# Patient Record
Sex: Female | Born: 1995 | Race: White | Hispanic: No | Marital: Single | Smoking: Former smoker
Health system: Southern US, Community
[De-identification: ages and names within clinical notes are randomized; demographics above are authoritative.]

## PROBLEM LIST (undated history)

## (undated) ENCOUNTER — Inpatient Hospital Stay: Payer: Self-pay

## (undated) DIAGNOSIS — J40 Bronchitis, not specified as acute or chronic: Secondary | ICD-10-CM

## (undated) DIAGNOSIS — J45909 Unspecified asthma, uncomplicated: Secondary | ICD-10-CM

## (undated) DIAGNOSIS — K219 Gastro-esophageal reflux disease without esophagitis: Secondary | ICD-10-CM

## (undated) HISTORY — PX: NO PAST SURGERIES: SHX2092

## (undated) HISTORY — PX: WISDOM TOOTH EXTRACTION: SHX21

---
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
## 2016-04-23 ENCOUNTER — Encounter: Payer: Self-pay | Admitting: Emergency Medicine

## 2016-04-23 ENCOUNTER — Emergency Department
Admission: EM | Admit: 2016-04-23 | Discharge: 2016-04-23 | Disposition: A | Discharge status: Home / Self Care | Payer: Medicaid Other | Attending: Emergency Medicine | Admitting: Emergency Medicine

## 2016-04-23 DIAGNOSIS — O23591 Infection of other part of genital tract in pregnancy, first trimester: Secondary | ICD-10-CM | POA: Insufficient documentation

## 2016-04-23 DIAGNOSIS — J45909 Unspecified asthma, uncomplicated: Secondary | ICD-10-CM | POA: Insufficient documentation

## 2016-04-23 DIAGNOSIS — Z3A01 Less than 8 weeks gestation of pregnancy: Secondary | ICD-10-CM | POA: Insufficient documentation

## 2016-04-23 DIAGNOSIS — O99331 Smoking (tobacco) complicating pregnancy, first trimester: Secondary | ICD-10-CM | POA: Insufficient documentation

## 2016-04-23 DIAGNOSIS — N760 Acute vaginitis: Secondary | ICD-10-CM

## 2016-04-23 DIAGNOSIS — F17210 Nicotine dependence, cigarettes, uncomplicated: Secondary | ICD-10-CM | POA: Diagnosis not present

## 2016-04-23 DIAGNOSIS — B9689 Other specified bacterial agents as the cause of diseases classified elsewhere: Secondary | ICD-10-CM

## 2016-04-23 DIAGNOSIS — O26891 Other specified pregnancy related conditions, first trimester: Secondary | ICD-10-CM | POA: Diagnosis present

## 2016-04-23 DIAGNOSIS — R300 Dysuria: Secondary | ICD-10-CM

## 2016-04-23 LAB — URINALYSIS COMPLETE WITH MICROSCOPIC (ARMC ONLY)
BACTERIA UA: NONE SEEN
BILIRUBIN URINE: NEGATIVE
Glucose, UA: NEGATIVE mg/dL
Nitrite: NEGATIVE
PH: 6 (ref 5.0–8.0)
PROTEIN: 30 mg/dL — AB
Specific Gravity, Urine: 1.03 (ref 1.005–1.030)

## 2016-04-23 LAB — CHLAMYDIA/NGC RT PCR (ARMC ONLY)
Chlamydia Tr: NOT DETECTED
N gonorrhoeae: NOT DETECTED

## 2016-04-23 LAB — WET PREP, GENITAL
Sperm: NONE SEEN
TRICH WET PREP: NONE SEEN
YEAST WET PREP: NONE SEEN

## 2016-04-23 MED ORDER — CEFTRIAXONE SODIUM 250 MG IJ SOLR
250.0000 mg | INTRAMUSCULAR | Status: DC
  Administered 2016-04-23: 250 mg via INTRAMUSCULAR

## 2016-04-23 MED ORDER — AZITHROMYCIN 250 MG PO TABS
1000.0000 mg | ORAL_TABLET | Freq: Once | ORAL | Status: AC
  Administered 2016-04-23: 1000 mg via ORAL

## 2016-04-23 MED ORDER — LIDOCAINE HCL (PF) 1 % IJ SOLN
INTRAMUSCULAR | Status: AC
  Filled 2016-04-23: qty 5

## 2016-04-23 MED ORDER — ONDANSETRON 4 MG PO TBDP
4.0000 mg | ORAL_TABLET | Freq: Once | ORAL | Status: AC
  Administered 2016-04-23: 4 mg via ORAL
  Filled 2016-04-23: qty 1

## 2016-04-23 MED ORDER — AZITHROMYCIN 250 MG PO TABS
ORAL_TABLET | ORAL | Status: AC
  Administered 2016-04-23: 1000 mg via ORAL
  Filled 2016-04-23: qty 4

## 2016-04-23 MED ORDER — ONDANSETRON HCL 4 MG PO TABS: 4.0000 mg | ORAL_TABLET | Freq: Every day | ORAL | 1 refills | Status: DC | PRN

## 2016-04-23 MED ORDER — CEFTRIAXONE SODIUM 250 MG IJ SOLR
INTRAMUSCULAR | Status: AC
Start: 2016-04-23 — End: 2016-04-23
  Administered 2016-04-23: 250 mg via INTRAMUSCULAR
  Filled 2016-04-23: qty 250

## 2016-04-23 MED ORDER — METRONIDAZOLE 500 MG PO TABS: 500.0000 mg | ORAL_TABLET | Freq: Two times a day (BID) | ORAL | 0 refills | Status: DC

## 2016-04-23 NOTE — ED Provider Notes (Signed)
[facility] Emergency Department Provider Note        Time seen: ----------------------------------------- 4:13 PM on 04/23/2016 -----------------------------------------    I have reviewed the triage vital signs and the nursing notes.   HISTORY  Chief Complaint Dysuria    HPI Penny Cortez is a 20 y.o. female who presents to the ER stating she did not feel well at work today. She said painful urination for the last 4 days, has had some vaginal discharge. She states she [redacted] weeks pregnant has had nausea but no vomiting or diarrhea. She denies vaginal bleeding or abdominal pain. She has not had this happen before.   Past Medical History:  Diagnosis Date  . Asthma   . Bronchitis   . GERD (gastroesophageal reflux disease)     There are no active problems to display for this patient.   History reviewed. No pertinent surgical history.  Allergies Patient has no known allergies.  Social History Social History  Substance Use Topics  . Smoking status: Current Every Day Smoker    Packs/day: 1.00    Types: Cigarettes  . Smokeless tobacco: Never Used  . Alcohol use No    Review of Systems Constitutional: Negative for fever. Cardiovascular: Negative for chest pain. Respiratory: Negative for shortness of breath. Gastrointestinal: Negative for abdominal pain, vomiting and diarrhea. Genitourinary: Positive for dysuria, negative for vaginal bleeding Musculoskeletal: Negative for back pain. Skin: Negative for rash. Neurological: Negative for headaches, focal weakness or numbness.  10-point ROS otherwise negative.  ____________________________________________   PHYSICAL EXAM:  VITAL SIGNS: ED Triage Vitals  Enc Vitals Group     BP 04/23/16 1507 129/75     Pulse Rate 04/23/16 1507 97     Resp 04/23/16 1507 20     Temp 04/23/16 1507 99.5 F (37.5 C)     Temp Source 04/23/16 1507 Oral     SpO2 04/23/16 1507 100 %     Weight 04/23/16 1508  185 lb (83.9 kg)     Height 04/23/16 1508 5\' 5"  (1.651 m)     Head Circumference --      Peak Flow --      Pain Score 04/23/16 1515 8     Pain Loc --      Pain Edu? --      Excl. in GC? --     Constitutional: Alert and oriented. Well appearing and in no distress. Eyes: Conjunctivae are normal. PERRL. Normal extraocular movements. ENT   Head: Normocephalic and atraumatic.   Nose: No congestion/rhinnorhea.   Mouth/Throat: Mucous membranes are moist.   Neck: No stridor. Cardiovascular: Normal rate, regular rhythm. No murmurs, rubs, or gallops. Respiratory: Normal respiratory effort without tachypnea nor retractions. Breath sounds are clear and equal bilaterally. No wheezes/rales/rhonchi. Gastrointestinal: Soft and nontender. Normal bowel sounds Genitourinary: White vaginal discharge, cervical motion tenderness Musculoskeletal: Nontender with normal range of motion in all extremities. No lower extremity tenderness nor edema. Neurologic:  Normal speech and language. No gross focal neurologic deficits are appreciated.  Skin:  Skin is warm, dry and intact. No rash noted. Psychiatric: Mood and affect are normal. Speech and behavior are normal.  ____________________________________________  ED COURSE:  Pertinent labs & imaging results that were available during my care of the patient were reviewed by me and considered in my medical decision making (see chart for details). Clinical Course   Patient presents to the ER in no distress, we will assess with urination pelvic examination.  Procedures ____________________________________________   LABS (  pertinent positives/negatives)  Labs Reviewed  WET PREP, GENITAL - Abnormal; Notable for the following:       Result Value   Clue Cells Wet Prep HPF POC PRESENT (*)    WBC, Wet Prep HPF POC MODERATE (*)    All other components within normal limits  URINALYSIS COMPLETEWITH MICROSCOPIC ([facility] ONLY) - Abnormal; Notable for the  following:    Color, Urine YELLOW (*)    APPearance HAZY (*)    Ketones, ur TRACE (*)    Hgb urine dipstick 1+ (*)    Protein, ur 30 (*)    Leukocytes, UA TRACE (*)    Squamous Epithelial / LPF 6-30 (*)    All other components within normal limits  CHLAMYDIA/NGC RT PCR ([facility] ONLY)  URINE CULTURE  ____________________________________________  FINAL ASSESSMENT AND PLAN  Dysuria, First trimester pregnancy  Plan: Patient with labs as dictated above. Patient is in no distress, I have covered for STD with Rocephin and Zithromax. She will be discharged with Flagyl for BV and Zofran for morning sickness. She is stable for outpatient follow-up.   [name], Cherryl Babin E, MD   Note: This dictation was prepared with Dragon dictation. Any transcriptional errors that result from this process are unintentional    Emily FilbertJonathan E Mishelle Hassan, MD 04/23/16 [phone]

## 2016-04-23 NOTE — ED Triage Notes (Signed)
Pt with painful urination for four days. Pt states she is [redacted] weeks pregnant and has not felt very good.

## 2016-04-24 LAB — URINE CULTURE: Special Requests: NORMAL

## 2016-04-27 ENCOUNTER — Telehealth: Payer: Self-pay | Admitting: Emergency Medicine

## 2016-04-27 NOTE — Telephone Encounter (Signed)
Patient called me asking for std results.  Gave her negative gc/chlamydia.  Says she thought we tested for herpes.  I told her I cannot see an order or result for that.  I advised that if she is concerned she should follow up with pcp.

## 2016-05-25 NOTE — L&D Delivery Note (Signed)
Delivery Note At 10:03 AM a viable female was delivered via Vaginal, Spontaneous Delivery at 1003 on 12/11/2016 (Presentation: vtx;  ).  APGAR:8/9 , ; weight  .   Placenta status:intact  , .  Cord:  with the following complications: Marland Kitchen.  Meconium staining  Vigorous cry at delivery . Peds in attendance . Delayed cord clamping   Anesthesia:  CLE Episiotomy:  None  Lacerations: 2nd degree Suture Repair: 2.0 3.0 vicryl Est. Blood Loss (mL):  200cc  Mom to postpartum.  Baby to Couplet care / Skin to Skin.  Penny Cortez 12/11/2016, 11:02 AM

## 2016-06-22 LAB — OB RESULTS CONSOLE HIV ANTIBODY (ROUTINE TESTING): HIV: NONREACTIVE

## 2016-07-07 ENCOUNTER — Emergency Department
Admission: EM | Admit: 2016-07-07 | Discharge: 2016-07-07 | Disposition: A | Discharge status: Home / Self Care | Payer: Medicaid Other | Attending: Emergency Medicine | Admitting: Emergency Medicine

## 2016-07-07 ENCOUNTER — Encounter: Payer: Self-pay | Admitting: Emergency Medicine

## 2016-07-07 DIAGNOSIS — Z79899 Other long term (current) drug therapy: Secondary | ICD-10-CM | POA: Diagnosis not present

## 2016-07-07 DIAGNOSIS — O99512 Diseases of the respiratory system complicating pregnancy, second trimester: Secondary | ICD-10-CM | POA: Insufficient documentation

## 2016-07-07 DIAGNOSIS — O99332 Smoking (tobacco) complicating pregnancy, second trimester: Secondary | ICD-10-CM | POA: Insufficient documentation

## 2016-07-07 DIAGNOSIS — Z3A18 18 weeks gestation of pregnancy: Secondary | ICD-10-CM | POA: Insufficient documentation

## 2016-07-07 DIAGNOSIS — F17210 Nicotine dependence, cigarettes, uncomplicated: Secondary | ICD-10-CM | POA: Diagnosis not present

## 2016-07-07 DIAGNOSIS — J069 Acute upper respiratory infection, unspecified: Secondary | ICD-10-CM | POA: Diagnosis not present

## 2016-07-07 DIAGNOSIS — J45909 Unspecified asthma, uncomplicated: Secondary | ICD-10-CM | POA: Insufficient documentation

## 2016-07-07 DIAGNOSIS — B9789 Other viral agents as the cause of diseases classified elsewhere: Secondary | ICD-10-CM

## 2016-07-07 MED ORDER — BENZONATATE 100 MG PO CAPS: 100.0000 mg | ORAL_CAPSULE | Freq: Three times a day (TID) | ORAL | 0 refills | Status: DC | PRN

## 2016-07-07 NOTE — ED Triage Notes (Signed)
Pt has had cough and sore throat for a few days. [redacted] weeks pregnant. Denies fevers. No distress currently. Just wants to be checked.

## 2016-07-07 NOTE — Discharge Instructions (Signed)
You have been seen in the Emergency Department (ED) today for a likely viral illness.  Please drink plenty of clear fluids (water, Gatorade, chicken broth, etc).  You may use Tylenol according to label instructions.  You can alternate between the two without any side effects.  ° °Please follow up with your doctor as listed above. ° °Call your doctor or return to the Emergency Department (ED) if you are unable to tolerate fluids due to vomiting, have worsening trouble breathing, become extremely tired or difficult to awaken, or if you develop any other symptoms that concern you. ° °

## 2016-07-07 NOTE — ED Provider Notes (Signed)
[facility] Emergency Department Provider Note  ____________________________________________   First MD Initiated Contact with Patient 07/07/16 1740     (approximate)  I have reviewed the triage vital signs and the nursing notes.   HISTORY  Chief Complaint Cough    HPI Penny Cortez is a 21 y.o. female who is [redacted] weeks pregnant with her first child who presents for evaluation of 3 days of mild URI symptoms including nasal congestion, runny nose, sore throat, and cough.  She denies fever/chills, chest pain, shortness of breath, difficulty swallowing, abdominal pain, vaginal bleeding.  She states that nothing particular makes her symptoms better or worse and they are moderate in severity.  She has had occasional nausea and vomiting during the pregnancy.  She is able to tolerate food and drink without issues.  She has started smoking again during the pregnancy but no she needs to quit.   Past Medical History:  Diagnosis Date  . Asthma   . Bronchitis   . GERD (gastroesophageal reflux disease)     There are no active problems to display for this patient.   History reviewed. No pertinent surgical history.  Prior to Admission medications   Medication Sig Start Date End Date Taking? Authorizing Provider  benzonatate (TESSALON PERLES) 100 MG capsule Take 1 capsule (100 mg total) by mouth 3 (three) times daily as needed for cough. 07/07/16   Loleta Rose, MD  metroNIDAZOLE (FLAGYL) 500 MG tablet Take 1 tablet (500 mg total) by mouth 2 (two) times daily. 04/23/16   Emily Filbert, MD  ondansetron (ZOFRAN) 4 MG tablet Take 1 tablet (4 mg total) by mouth daily as needed for nausea or vomiting. 04/23/16   Emily Filbert, MD    Allergies Patient has no known allergies.  History reviewed. No pertinent family history.  Social History Social History  Substance Use Topics  . Smoking status: Current Every Day Smoker    Packs/day: 1.00    Types:  Cigarettes  . Smokeless tobacco: Never Used  . Alcohol use No    Review of Systems Constitutional: No fever/chills Eyes: No visual changes. ENT: Mild sore throat, no difficulty swallowing.  No ear pain.  +Nasal congestion. Cardiovascular: Denies chest pain. Respiratory: Persistent cough. Denies shortness of breath. Gastrointestinal: No abdominal pain.  Occasional nausea/vomiting.  No diarrhea.  No constipation. Genitourinary: Negative for dysuria. Musculoskeletal: Negative for back pain. Skin: Negative for rash. Neurological: Negative for headaches, focal weakness or numbness.  10-point ROS otherwise negative.  ____________________________________________   PHYSICAL EXAM:  VITAL SIGNS: ED Triage Vitals  Enc Vitals Group     BP 07/07/16 1645 132/73     Pulse Rate 07/07/16 1645 89     Resp 07/07/16 1645 18     Temp 07/07/16 1645 98.2 F (36.8 C)     Temp Source 07/07/16 1645 Oral     SpO2 07/07/16 1645 98 %     Weight 07/07/16 1645 202 lb (91.6 kg)     Height 07/07/16 1645 5\' 5"  (1.651 m)     Head Circumference --      Peak Flow --      Pain Score 07/07/16 1646 6     Pain Loc --      Pain Edu? --      Excl. in GC? --     Constitutional: Alert and oriented. Well appearing and in no acute distress. Eyes: Conjunctivae are normal. PERRL. EOMI. Head: Atraumatic. Nose: Mild congestion/rhinnorhea. Mouth/Throat: Mucous membranes are  moist.  Oropharynx non-erythematous without any posterior lesions/petechiae Neck: No stridor.  No meningeal signs.   Cardiovascular: Normal rate, regular rhythm. Good peripheral circulation. Grossly normal heart sounds. Respiratory: Normal respiratory effort.  No retractions. Lungs CTAB. Gastrointestinal: Soft and nontender. No distention.  Musculoskeletal: No lower extremity tenderness nor edema. No gross deformities of extremities. Neurologic:  Normal speech and language. No gross focal neurologic deficits are appreciated.  Skin:  Skin is  warm, dry and intact. No rash noted. Psychiatric: Mood and affect are normal. Speech and behavior are normal.  ____________________________________________   LABS (all labs ordered are listed, but only abnormal results are displayed)  Labs Reviewed - No data to display ____________________________________________  EKG  None - EKG not ordered by ED physician ____________________________________________  RADIOLOGY   No results found.  ____________________________________________   PROCEDURES  Procedure(s) performed:   Procedures   Critical Care performed: No ____________________________________________   INITIAL IMPRESSION / ASSESSMENT AND PLAN / ED COURSE  Pertinent labs & imaging results that were available during my care of the patient were reviewed by me and considered in my medical decision making (see chart for details).  Patient is well-appearing and in no acute distress with a normal physical exam and normal vital signs.  There is no indication for any imaging.  She has signs and symptoms consistent with a viral illness.  I counseled her to stop smoking and follow-up with her regular doctor.  I will give her prescription for Tessalon for her cough.  I gave my usual and customary return precautions.         ____________________________________________  FINAL CLINICAL IMPRESSION(S) / ED DIAGNOSES  Final diagnoses:  Viral URI with cough     MEDICATIONS GIVEN DURING THIS VISIT:  Medications - No data to display   NEW OUTPATIENT MEDICATIONS STARTED DURING THIS VISIT:  New Prescriptions   BENZONATATE (TESSALON PERLES) 100 MG CAPSULE    Take 1 capsule (100 mg total) by mouth 3 (three) times daily as needed for cough.    Modified Medications   No medications on file    Discontinued Medications   No medications on file     Note:  This document was prepared using Dragon voice recognition software and may include unintentional dictation  errors.    Loleta Roseory Tarus Briski, MD 07/07/16 [phone]

## 2016-07-18 ENCOUNTER — Emergency Department
Admission: EM | Admit: 2016-07-18 | Discharge: 2016-07-18 | Disposition: A | Discharge status: Home / Self Care | Payer: Medicaid Other | Attending: Emergency Medicine | Admitting: Emergency Medicine

## 2016-07-18 ENCOUNTER — Encounter: Payer: Self-pay | Admitting: Emergency Medicine

## 2016-07-18 DIAGNOSIS — O99332 Smoking (tobacco) complicating pregnancy, second trimester: Secondary | ICD-10-CM | POA: Diagnosis not present

## 2016-07-18 DIAGNOSIS — F17210 Nicotine dependence, cigarettes, uncomplicated: Secondary | ICD-10-CM | POA: Insufficient documentation

## 2016-07-18 DIAGNOSIS — O26892 Other specified pregnancy related conditions, second trimester: Secondary | ICD-10-CM | POA: Diagnosis present

## 2016-07-18 DIAGNOSIS — O99512 Diseases of the respiratory system complicating pregnancy, second trimester: Secondary | ICD-10-CM | POA: Insufficient documentation

## 2016-07-18 DIAGNOSIS — J40 Bronchitis, not specified as acute or chronic: Secondary | ICD-10-CM | POA: Diagnosis not present

## 2016-07-18 DIAGNOSIS — R0602 Shortness of breath: Secondary | ICD-10-CM | POA: Diagnosis not present

## 2016-07-18 MED ORDER — AZITHROMYCIN 250 MG PO TABS: ORAL_TABLET | ORAL | 0 refills | Status: DC

## 2016-07-18 MED ORDER — ALBUTEROL SULFATE HFA 108 (90 BASE) MCG/ACT IN AERS: 2.0000 | INHALATION_SPRAY | Freq: Four times a day (QID) | RESPIRATORY_TRACT | 0 refills | Status: DC | PRN

## 2016-07-18 NOTE — ED Triage Notes (Signed)
Pt to ed with c/o cough, congestion x 2 weeks.  Pt states seen recently here but cough remains the same.

## 2016-07-18 NOTE — ED Provider Notes (Signed)
[facility] Emergency Department Provider Note  ____________________________________________  Time seen: Approximately 4:00 PM  I have reviewed the triage vital signs and the nursing notes.   HISTORY  Chief Complaint Cough and Shortness of Breath    HPI Penny Cortez is a 21 y.o. female that presents to emergency department with 3 weeks of nonproductive cough.Patient states that occasionally she coughs so hard she vomits. Patient has had some difficulty breathing while coughing. Patient has been taking Tessalon Perles that were prescribed without relief. Patient smokes about a half a pack a day but is trying to quit. Patient is [redacted] weeks pregnant. Patient denies fever, headache, shortness of breath, chest pain, abdominal pain, diarrhea.   Past Medical History:  Diagnosis Date  . Asthma   . Bronchitis   . GERD (gastroesophageal reflux disease)     There are no active problems to display for this patient.   History reviewed. No pertinent surgical history.  Prior to Admission medications   Medication Sig Start Date End Date Taking? Authorizing Provider  albuterol (PROVENTIL HFA;VENTOLIN HFA) 108 (90 Base) MCG/ACT inhaler Inhale 2 puffs into the lungs every 6 (six) hours as needed for wheezing or shortness of breath. 07/18/16   Penny DerryAshley Michell Giuliano, PA-C  azithromycin (ZITHROMAX Z-PAK) 250 MG tablet Take 2 tablets (500 mg) on  Day 1,  followed by 1 tablet (250 mg) once daily on Days 2 through 5. 07/18/16   Penny DerryAshley Lamyah Creed, PA-C  benzonatate (TESSALON PERLES) 100 MG capsule Take 1 capsule (100 mg total) by mouth 3 (three) times daily as needed for cough. 07/07/16   Loleta Roseory Forbach, MD  metroNIDAZOLE (FLAGYL) 500 MG tablet Take 1 tablet (500 mg total) by mouth 2 (two) times daily. 04/23/16   Emily FilbertJonathan E Williams, MD  ondansetron (ZOFRAN) 4 MG tablet Take 1 tablet (4 mg total) by mouth daily as needed for nausea or vomiting. 04/23/16   Emily FilbertJonathan E Williams, MD     Allergies Patient has no known allergies.  No family history on file.  Social History Social History  Substance Use Topics  . Smoking status: Current Every Day Smoker    Packs/day: 1.00    Types: Cigarettes  . Smokeless tobacco: Never Used  . Alcohol use No     Review of Systems  Constitutional: No fever Eyes: No visual changes. No discharge. ENT: Negative for congestion and rhinorrhea. Cardiovascular: No chest pain. Respiratory: Positive for cough.  Gastrointestinal: No abdominal pain. No diarrhea.  No constipation. Musculoskeletal: Negative for musculoskeletal pain. Skin: Negative for rash, abrasions, lacerations, ecchymosis. Neurological: Negative for headaches.   ____________________________________________   PHYSICAL EXAM:  VITAL SIGNS: ED Triage Vitals  Enc Vitals Group     BP 07/18/16 1431 136/66     Pulse Rate 07/18/16 1431 84     Resp 07/18/16 1431 20     Temp 07/18/16 1431 98.3 F (36.8 C)     Temp Source 07/18/16 1431 Oral     SpO2 07/18/16 1431 99 %     Weight 07/18/16 1428 202 lb (91.6 kg)     Height 07/18/16 1428 5\' 5"  (1.651 m)     Head Circumference --      Peak Flow --      Pain Score 07/18/16 1428 0     Pain Loc --      Pain Edu? --      Excl. in GC? --     Eyes: Conjunctivae are normal. PERRL. EOMI. No discharge. Head: Atraumatic. ENT:  No frontal and maxillary sinus tenderness.      Ears: Tympanic membranes pearly gray with good landmarks. No discharge.      Nose:       Mouth/Throat: Mucous membranes are moist. Oropharynx non-erythematous. Tonsils not enlarged. No exudates. Uvula midline. Neck: No stridor.   Hematological/Lymphatic/Immunilogical: No cervical lymphadenopathy. Cardiovascular: Normal rate, regular rhythm.  Good peripheral circulation. Respiratory: Normal respiratory effort without tachypnea or retractions. Lungs CTAB. Good air entry to the bases with no decreased or absent breath sounds. Musculoskeletal: Full range  of motion to all extremities. No gross deformities appreciated. Neurologic:  Normal speech and language. No gross focal neurologic deficits are appreciated.  Skin:  Skin is warm, dry and intact. No rash noted.   ____________________________________________   LABS (all labs ordered are listed, but only abnormal results are displayed)  Labs Reviewed - No data to display ____________________________________________  EKG   ____________________________________________  RADIOLOGY   No results found.  ____________________________________________    PROCEDURES  Procedure(s) performed:    Procedures    Medications - No data to display   ____________________________________________   INITIAL IMPRESSION / ASSESSMENT AND PLAN / ED COURSE  Pertinent labs & imaging results that were available during my care of the patient were reviewed by me and considered in my medical decision making (see chart for details).  Review of the [place] CSRS was performed in accordance of the [facility] prior to dispensing any controlled drugs.     Patient's diagnosis is consistent with bronchitis. Vital signs and exam are reassuring. Patient appears well and is staying well hydrated. Patient is adamant that cough has been going on for 3 weeks although based on last ER note her cough has been going on for 2 weeks. Patient is pregnant and appears well so chest x-ray will be deferred at this time. Encouragement to quit smoking was given. Patient will be discharged home with prescriptions for azithromycin and albuterol. Education about risks and benefits of using albuterol and that it is a class C medication was provided. Patient is ready to go home.  Patient is to follow up with PCP as needed or otherwise directed. Patient is given ED precautions to return to the ED for any worsening or new symptoms.     ____________________________________________  FINAL CLINICAL IMPRESSION(S) / ED DIAGNOSES  Final  diagnoses:  Bronchitis      NEW MEDICATIONS STARTED DURING THIS VISIT:  New Prescriptions   ALBUTEROL (PROVENTIL HFA;VENTOLIN HFA) 108 (90 BASE) MCG/ACT INHALER    Inhale 2 puffs into the lungs every 6 (six) hours as needed for wheezing or shortness of breath.   AZITHROMYCIN (ZITHROMAX Z-PAK) 250 MG TABLET    Take 2 tablets (500 mg) on  Day 1,  followed by 1 tablet (250 mg) once daily on Days 2 through 5.        This chart was dictated using voice recognition software/Dragon. Despite best efforts to proofread, errors can occur which can change the meaning. Any change was purely unintentional.    Penny Derry, PA-C 07/18/16 1634    Jene Every, MD 07/18/16 2230

## 2016-08-05 LAB — OB RESULTS CONSOLE VARICELLA ZOSTER ANTIBODY, IGG: Varicella: IMMUNE

## 2016-11-09 LAB — OB RESULTS CONSOLE GC/CHLAMYDIA
CHLAMYDIA, DNA PROBE: NEGATIVE
Gonorrhea: NEGATIVE

## 2016-11-09 LAB — OB RESULTS CONSOLE RPR: RPR: NONREACTIVE

## 2016-11-09 LAB — OB RESULTS CONSOLE GBS: GBS: POSITIVE

## 2016-11-17 ENCOUNTER — Observation Stay
Admission: EM | Admit: 2016-11-17 | Discharge: 2016-11-17 | Disposition: A | Discharge status: Home / Self Care | Payer: Medicaid Other | Attending: Obstetrics and Gynecology | Admitting: Obstetrics and Gynecology

## 2016-11-17 DIAGNOSIS — Z3A37 37 weeks gestation of pregnancy: Secondary | ICD-10-CM | POA: Diagnosis not present

## 2016-11-17 DIAGNOSIS — O368130 Decreased fetal movements, third trimester, not applicable or unspecified: Principal | ICD-10-CM | POA: Insufficient documentation

## 2016-11-17 DIAGNOSIS — Z3490 Encounter for supervision of normal pregnancy, unspecified, unspecified trimester: Secondary | ICD-10-CM
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
## 2016-11-17 NOTE — Discharge Summary (Signed)
Triage Visit with NST    Penny Cortez is a 21 y.o. G1P0. She is at 253w3d gestation.  Indication: Decreased fetal movement  S: Resting comfortably. no CTX, no VB.  - Patient is now feeling baby  Move well.   :  BP 125/65 Comment: large cuff  Pulse 98   Temp 97.7 F (36.5 C) (Oral)   Resp 18   Ht 5\' 5"  (1.651 m)   Wt 113.4 kg (250 lb)   LMP 02/29/2016   BMI 41.60 kg/m  No results found for this or any previous visit (from the past 48 hour(s)).   Gen: NAD, AAOx3      Abd: FNTTP      Ext: Non-tender, Nonedmeatous    FHT: 140s, moderate variability, +accels, no decels TOCO: quiet SVE: fingertip, thick and high   A/P:  21 y.o. G1P0 193w3d with concerns for decreased fetal movement, now resolved.   Labor: not present.   Fetal Wellbeing: NST is Reassuring reactive tracing   D/c home stable, precautions reviewed, follow-up as scheduled.

## 2016-11-23 ENCOUNTER — Ambulatory Visit: Payer: Medicaid Other

## 2016-11-23 ENCOUNTER — Ambulatory Visit
Admission: EM | Admit: 2016-11-23 | Discharge: 2016-11-23 | Disposition: A | Discharge status: Home / Self Care | Payer: Medicaid Other | Attending: Family Medicine | Admitting: Family Medicine

## 2016-11-23 DIAGNOSIS — M79672 Pain in left foot: Secondary | ICD-10-CM | POA: Diagnosis not present

## 2016-11-23 DIAGNOSIS — Z331 Pregnant state, incidental: Secondary | ICD-10-CM | POA: Diagnosis not present

## 2016-11-23 DIAGNOSIS — Z3A37 37 weeks gestation of pregnancy: Secondary | ICD-10-CM

## 2016-11-23 NOTE — Discharge Instructions (Signed)
Follow up with PCP with for possible ultrasound referral

## 2016-11-23 NOTE — ED Triage Notes (Signed)
Patient complains of left foot pain. Patient has an area at the bottom of her foot that is painful and she is unsure if she stepped on something. Patient states that she noticed this around 3 days ago.

## 2016-11-26 NOTE — ED Provider Notes (Signed)
[facility]    CSN: [id number] Arrival date & time: 11/23/16  1412     History   Chief Complaint Chief Complaint  Patient presents with  . Foot Pain    HPI Penny Cortez is a 21 y.o. female.   21 yo female with a c/o left foot pain and foreign body sensation at bottom of foot for the last 3 days. States she does not recall any specific trauma, injury, stepping on something, fevers, chills, drainage, rash. Patient states she's approximately [redacted] weeks pregnant.    The history is provided by the patient.  Foot Pain     Past Medical History:  Diagnosis Date  . Asthma   . Bronchitis   . GERD (gastroesophageal reflux disease)     Patient Active Problem List   Diagnosis Date Noted  . Pregnancy 11/17/2016    Past Surgical History:  Procedure Laterality Date  . NO PAST SURGERIES      OB History    Gravida Para Term Preterm AB Living   1             SAB TAB Ectopic Multiple Live Births                   Home Medications    Prior to Admission medications   Medication Sig Start Date End Date Taking? Authorizing Provider  acyclovir (ZOVIRAX) 200 MG capsule Take by mouth daily. Patient unsure of dosage   Yes [provider]  albuterol (PROVENTIL HFA;VENTOLIN HFA) 108 (90 Base) MCG/ACT inhaler Inhale 2 puffs into the lungs every 6 (six) hours as needed for wheezing or shortness of breath. 07/18/16  Yes Penny DerryWagner, [name], PA-C  azithromycin (ZITHROMAX Z-PAK) 250 MG tablet Take 2 tablets (500 mg) on  Day 1,  followed by 1 tablet (250 mg) once daily on Days 2 through 5. Patient not taking: Reported on 11/17/2016 07/18/16   Penny DerryWagner, [name], PA-C  benzonatate (TESSALON PERLES) 100 MG capsule Take 1 capsule (100 mg total) by mouth 3 (three) times daily as needed for cough. Patient not taking: Reported on 11/17/2016 07/07/16   [name], [name], MD  metroNIDAZOLE (FLAGYL) 500 MG tablet Take 1 tablet (500 mg total) by mouth 2 (two) times daily. Patient not taking:  Reported on 11/17/2016 04/23/16   [name], [name], MD  ondansetron (ZOFRAN) 4 MG tablet Take 1 tablet (4 mg total) by mouth daily as needed for nausea or vomiting. Patient not taking: Reported on 11/17/2016 04/23/16   [name], [name], MD  Prenatal Vit-Fe Fumarate-FA (PRENATAL MULTIVITAMIN) TABS tablet Take 1 tablet by mouth daily at 12 noon.    [provider]    Family History History reviewed. No pertinent family history.  Social History Social History  Substance Use Topics  . Smoking status: Former Smoker    Packs/day: 1.00    Types: Cigarettes  . Smokeless tobacco: Never Used  . Alcohol use No     Allergies   Patient has no known allergies.   Review of Systems Review of Systems   Physical Exam Triage Vital Signs ED Triage Vitals  Enc Vitals Group     BP 11/23/16 1508 115/77     Pulse Rate 11/23/16 1508 90     Resp 11/23/16 1508 18     Temp 11/23/16 1508 98 F (36.7 C)     Temp Source 11/23/16 1508 Oral     SpO2 11/23/16 1508 99 %     Weight 11/23/16 1506 250 lb (  113.4 kg)     Height --      Head Circumference --      Peak Flow --      Pain Score 11/23/16 1506 4     Pain Loc --      Pain Edu? --      Excl. in GC? --    No data found.   Updated Vital Signs BP 115/77 (BP Location: Left Arm)   Pulse 90   Temp 98 F (36.7 C) (Oral)   Resp 18   Wt 250 lb (113.4 kg)   LMP 02/29/2016   SpO2 99%   BMI 41.60 kg/m   Visual Acuity Right Eye Distance:   Left Eye Distance:   Bilateral Distance:    Right Eye Near:   Left Eye Near:    Bilateral Near:     Physical Exam  Constitutional: She appears well-developed and well-nourished. No distress.  Musculoskeletal:       Left foot: There is tenderness (left foot posterior plantar aspect with mild tenderness and subcutaneous nodule palpated; no puncture wound noted; no drainage or erythema). There is normal range of motion, no bony tenderness, no swelling, normal capillary refill, no  crepitus, no deformity and no laceration.  Skin: She is not diaphoretic.  Nursing note and vitals reviewed.    UC Treatments / Results  Labs (all labs ordered are listed, but only abnormal results are displayed) Labs Reviewed - No data to display  EKG  EKG Interpretation None       Radiology No results found.  Procedures Procedures (including critical care time)  Medications Ordered in UC Medications - No data to display   Initial Impression / Assessment and Plan / UC Course  I have reviewed the triage vital signs and the nursing notes.  Pertinent labs & imaging results that were available during my care of the patient were reviewed by me and considered in my medical decision making (see chart for details).      Final Clinical Impressions(s) / UC Diagnoses   Final diagnoses:  Foot pain, left  [redacted] weeks gestation of pregnancy    New Prescriptions Discharge Medication List as of 11/23/2016  4:10 PM     1. diagnosis reviewed with patient 2. Discussed with patient possible etiologies 3. Recommend supportive treatment with otc tylenol prn 4. Follow-up with PCP for possible outpatient referral for US soft tissue foot   Payton Mccallum, MD 11/26/16 2119

## 2016-12-06 ENCOUNTER — Observation Stay
Admission: EM | Admit: 2016-12-06 | Discharge: 2016-12-06 | Disposition: A | Discharge status: Home / Self Care | Payer: Medicaid Other | Attending: Obstetrics and Gynecology | Admitting: Obstetrics and Gynecology

## 2016-12-06 DIAGNOSIS — O99613 Diseases of the digestive system complicating pregnancy, third trimester: Secondary | ICD-10-CM | POA: Insufficient documentation

## 2016-12-06 DIAGNOSIS — K219 Gastro-esophageal reflux disease without esophagitis: Secondary | ICD-10-CM | POA: Diagnosis not present

## 2016-12-06 DIAGNOSIS — Z87891 Personal history of nicotine dependence: Secondary | ICD-10-CM | POA: Diagnosis not present

## 2016-12-06 DIAGNOSIS — O471 False labor at or after 37 completed weeks of gestation: Principal | ICD-10-CM | POA: Insufficient documentation

## 2016-12-06 DIAGNOSIS — Z79899 Other long term (current) drug therapy: Secondary | ICD-10-CM | POA: Insufficient documentation

## 2016-12-06 DIAGNOSIS — Z3A40 40 weeks gestation of pregnancy: Secondary | ICD-10-CM | POA: Insufficient documentation

## 2016-12-06 DIAGNOSIS — O479 False labor, unspecified: Secondary | ICD-10-CM | POA: Diagnosis present

## 2016-12-06 DIAGNOSIS — O99513 Diseases of the respiratory system complicating pregnancy, third trimester: Secondary | ICD-10-CM | POA: Insufficient documentation

## 2016-12-06 DIAGNOSIS — J45909 Unspecified asthma, uncomplicated: Secondary | ICD-10-CM | POA: Diagnosis not present

## 2016-12-06 NOTE — Progress Notes (Signed)
Patient ID: Penny Cortez, female   DOB: 31-Oct-1995, 21 y.o.   MRN: [id number]  Penny Cortez is a 21 y.o. female. She is at [redacted]w[redacted]d gestation. Patient's last menstrual period was 02/29/2016. Estimated Date of Delivery: 12/05/16  Prenatal care site: [facility] Chief complaint: Uterine ctx Location: Onset/timing: Duration: Quality:  Severity: Aggravating or alleviating conditions: Associated signs/symptoms: Context:  S: Resting comfortably. no CTX, no VB.no LOF,  Active fetal movement. utx CTX   Maternal Medical History:   Past Medical History:  Diagnosis Date  . Asthma   . Bronchitis   . GERD (gastroesophageal reflux disease)     Past Surgical History:  Procedure Laterality Date  . NO PAST SURGERIES    . WISDOM TOOTH EXTRACTION      No Known Allergies  Prior to Admission medications   Medication Sig Start Date End Date Taking? Authorizing Provider  acyclovir (ZOVIRAX) 200 MG capsule Take by mouth daily. Patient unsure of dosage   Yes [provider]  Prenatal Vit-Fe Fumarate-FA (PRENATAL MULTIVITAMIN) TABS tablet Take 1 tablet by mouth daily at 12 noon.   Yes [provider]  albuterol (PROVENTIL HFA;VENTOLIN HFA) 108 (90 Base) MCG/ACT inhaler Inhale 2 puffs into the lungs every 6 (six) hours as needed for wheezing or shortness of breath. Patient not taking: Reported on 12/06/2016 07/18/16   Penny Derry, PA-C  azithromycin (ZITHROMAX Z-PAK) 250 MG tablet Take 2 tablets (500 mg) on  Day 1,  followed by 1 tablet (250 mg) once daily on Days 2 through 5. Patient not taking: Reported on 11/17/2016 07/18/16   Penny Derry, PA-C  benzonatate (TESSALON PERLES) 100 MG capsule Take 1 capsule (100 mg total) by mouth 3 (three) times daily as needed for cough. Patient not taking: Reported on 11/17/2016 07/07/16   Loleta Rose, MD  metroNIDAZOLE (FLAGYL) 500 MG tablet Take 1 tablet (500 mg total) by mouth 2 (two) times daily. Patient not taking: Reported  on 11/17/2016 04/23/16   Emily Filbert, MD  ondansetron (ZOFRAN) 4 MG tablet Take 1 tablet (4 mg total) by mouth daily as needed for nausea or vomiting. Patient not taking: Reported on 11/17/2016 04/23/16   Emily Filbert, MD     Social History: She  reports that she has quit smoking. Her smoking use included Cigarettes. She smoked 1.00 pack per day. She has never used smokeless tobacco. She reports that she does not drink alcohol or use drugs.  Family History: family history is not on file.  no history of gyn cancers  Review of Systems: A full review of systems was performed and negative except as noted in the HPI.     O:  BP 137/78 (BP Location: Right Arm)   Pulse 95   Temp 97.7 F (36.5 C) (Oral)   Resp 16   Ht 5\' 5"  (1.651 m)   Wt 113.4 kg (250 lb)   LMP 02/29/2016   BMI 41.60 kg/m  No results found for this or any previous visit (from the past 48 hour(s)).   Constitutional: NAD, AAOx3  HE/ENT: extraocular movements grossly intact, moist mucous membranes CV: RRR PULM: nl respiratory effort, CTABL     Abd: gravid, non-tender, non-distended, soft      Ext: Non-tender, Nonedmeatous   Psych: mood appropriate, speech normal Pelvic 1 cm per RN  NST: REACTIVE  Baseline: 130 Variability: moderate Accelerations present x >2 Decelerations absent Time  CTX q 7-10  A/P: 21 y.o. [redacted]w[redacted]d here for antenatal surveillance for  uterine ctx   Labor: not present.   Fetal Wellbeing: Reassuring Cat 1 tracing.  Reactive NST   D/c home stable, precautions reviewed, follow-up as scheduled.   ----- Jennell Cornerhomas Fidel Caggiano, MD Attending Obstetrician and Gynecologist [facility], Department of OB/GYN [facility]

## 2016-12-06 NOTE — Discharge Instructions (Signed)
Please keep followup appointment on Thursday.  If you have any questions or concerns please call the on call provider.  You may also call the nurse's desk at the [facility] at [phone].  If you have any urgent concerns please go to the nearest emergency department.

## 2016-12-06 NOTE — OB Triage Note (Signed)
[facility] of [facility]d contractions that started last night around 9 pm. Denies LOF and bleeding.

## 2016-12-06 NOTE — Discharge Summary (Signed)
Schermerhorn, Ihor Austin, MD  Obstetrics    [] Hide copied text [] Hover for attribution information Patient ID: Penny Cortez, female   DOB: [birth year], 21 y.o.   MRN: [id number]  Penny Cortez is a 21 y.o. female. She is at [redacted]w[redacted]d gestation. Patient's last menstrual period was 02/29/2016. Estimated Date of Delivery: 12/05/16  Prenatal care site: [facility] Chief complaint: Uterine ctx Location: Onset/timing: Duration: Quality:  Severity: Aggravating or alleviating conditions: Associated signs/symptoms: Context:  S: Resting comfortably. noCTX, no VB.no LOF, Active fetal movement. utx CTX   Maternal Medical History:       Past Medical History:  Diagnosis Date  . Asthma   . Bronchitis   . GERD (gastroesophageal reflux disease)          Past Surgical History:  Procedure Laterality Date  . NO PAST SURGERIES    . WISDOM TOOTH EXTRACTION      No Known Allergies         Prior to Admission medications   Medication Sig Start Date End Date Taking? Authorizing Provider  acyclovir (ZOVIRAX) 200 MG capsule Take by mouth daily. Patient unsure of dosage   Yes [provider]  Prenatal Vit-Fe Fumarate-FA (PRENATAL MULTIVITAMIN) TABS tablet Take 1 tablet by mouth daily at 12 noon.   Yes [provider]  albuterol (PROVENTIL HFA;VENTOLIN HFA) 108 (90 Base) MCG/ACT inhaler Inhale 2 puffs into the lungs every 6 (six) hours as needed for wheezing or shortness of breath. Patient not taking: Reported on 12/06/2016 07/18/16   Enid Derry, PA-C  azithromycin (ZITHROMAX Z-PAK) 250 MG tablet Take 2 tablets (500 mg) on  Day 1,  followed by 1 tablet (250 mg) once daily on Days 2 through 5. Patient not taking: Reported on 11/17/2016 07/18/16   Enid Derry, PA-C  benzonatate (TESSALON PERLES) 100 MG capsule Take 1 capsule (100 mg total) by mouth 3 (three) times daily as needed for cough. Patient not taking: Reported on 11/17/2016 07/07/16    Loleta Rose, MD  metroNIDAZOLE (FLAGYL) 500 MG tablet Take 1 tablet (500 mg total) by mouth 2 (two) times daily. Patient not taking: Reported on 11/17/2016 04/23/16   Emily Filbert, MD  ondansetron (ZOFRAN) 4 MG tablet Take 1 tablet (4 mg total) by mouth daily as needed for nausea or vomiting. Patient not taking: Reported on 11/17/2016 04/23/16   Emily Filbert, MD     Social History: She  reports that she has quit smoking. Her smoking use included Cigarettes. She smoked 1.00 pack per day. She has never used smokeless tobacco. She reports that she does not drink alcohol or use drugs.  Family History: family history is not on file.  no history of gyn cancers  Review of Systems: A full review of systems was performed and negative except as noted in the HPI.     O:  BP 137/78 (BP Location: Right Arm)   Pulse 95   Temp 97.7 F (36.5 C) (Oral)   Resp 16   Ht 5\' 5"  (1.651 m)   Wt 113.4 kg (250 lb)   LMP 02/29/2016   BMI 41.60 kg/m  No results found for this or any previous visit (from the past 48 hour(s)).   Constitutional: NAD, AAOx3  HE/ENT: extraocular movements grossly intact, moist mucous membranes CV: RRR PULM: nl respiratory effort, CTABL  Abd: gravid, non-tender, non-distended, soft                                                  Ext: Non-tender, Nonedmeatous                     Psych: mood appropriate, speech normal Pelvic 1 cm per RN  NST: REACTIVE  Baseline: 130 Variability: moderate Accelerations present x >2 Decelerations absent Time 20mins  CTX q 7-10  A/P: 21 y.o. 721w1d here for antenatal surveillance for uterine ctx   Labor: not present.   Fetal Wellbeing: Reassuring Cat 1 tracing.  Reactive NST   D/c home stable, precautions reviewed, follow-up as scheduled.   ----- Jennell Cornerhomas Schermerhorn, MD Attending Obstetrician and Gynecologist [facility], Department of OB/GYN [facility]

## 2016-12-10 ENCOUNTER — Other Ambulatory Visit: Payer: Self-pay | Admitting: Obstetrics and Gynecology

## 2016-12-10 ENCOUNTER — Encounter: Payer: Self-pay | Admitting: *Deleted

## 2016-12-10 ENCOUNTER — Inpatient Hospital Stay
Admission: EM | Admit: 2016-12-10 | Discharge: 2016-12-12 | DRG: 774 | Disposition: A | Discharge status: Home / Self Care | Payer: Medicaid Other | Attending: Obstetrics & Gynecology | Admitting: Obstetrics & Gynecology

## 2016-12-10 DIAGNOSIS — Z3A40 40 weeks gestation of pregnancy: Secondary | ICD-10-CM

## 2016-12-10 DIAGNOSIS — O99824 Streptococcus B carrier state complicating childbirth: Secondary | ICD-10-CM | POA: Diagnosis present

## 2016-12-10 DIAGNOSIS — J45909 Unspecified asthma, uncomplicated: Secondary | ICD-10-CM | POA: Diagnosis present

## 2016-12-10 DIAGNOSIS — A6000 Herpesviral infection of urogenital system, unspecified: Secondary | ICD-10-CM | POA: Diagnosis present

## 2016-12-10 DIAGNOSIS — O9952 Diseases of the respiratory system complicating childbirth: Secondary | ICD-10-CM | POA: Diagnosis present

## 2016-12-10 DIAGNOSIS — Z87891 Personal history of nicotine dependence: Secondary | ICD-10-CM

## 2016-12-10 DIAGNOSIS — O9832 Other infections with a predominantly sexual mode of transmission complicating childbirth: Secondary | ICD-10-CM | POA: Diagnosis present

## 2016-12-10 NOTE — OB Triage Note (Signed)
Pt arrive to [facility] for complaints of lower abdominal and back pain for the last several hours. Pt reports positive fetal movement and some vaginal discharge but denies any leaking fluid or vaginal bleeding. EFM and TOCO applied. Significant other at bedside.

## 2016-12-10 NOTE — Progress Notes (Signed)
Dr Elesa MassedWard present at bedside. MD performed sterile speculum exam, nitrazine and SVE. No HSV lesions visualized per MD. Nitrazine neg. Orders received for pt to ambulate in halls and recheck pt in one hour.

## 2016-12-11 ENCOUNTER — Inpatient Hospital Stay: Payer: Medicaid Other | Admitting: Anesthesiology

## 2016-12-11 DIAGNOSIS — O9832 Other infections with a predominantly sexual mode of transmission complicating childbirth: Secondary | ICD-10-CM | POA: Diagnosis present

## 2016-12-11 DIAGNOSIS — Z3493 Encounter for supervision of normal pregnancy, unspecified, third trimester: Secondary | ICD-10-CM | POA: Diagnosis present

## 2016-12-11 DIAGNOSIS — Z87891 Personal history of nicotine dependence: Secondary | ICD-10-CM | POA: Diagnosis not present

## 2016-12-11 DIAGNOSIS — O99824 Streptococcus B carrier state complicating childbirth: Secondary | ICD-10-CM | POA: Diagnosis present

## 2016-12-11 DIAGNOSIS — O9952 Diseases of the respiratory system complicating childbirth: Secondary | ICD-10-CM | POA: Diagnosis present

## 2016-12-11 DIAGNOSIS — A6000 Herpesviral infection of urogenital system, unspecified: Secondary | ICD-10-CM | POA: Diagnosis present

## 2016-12-11 DIAGNOSIS — J45909 Unspecified asthma, uncomplicated: Secondary | ICD-10-CM | POA: Diagnosis present

## 2016-12-11 DIAGNOSIS — Z3A40 40 weeks gestation of pregnancy: Secondary | ICD-10-CM | POA: Diagnosis not present

## 2016-12-11 LAB — CBC
HEMATOCRIT: 33.1 % — AB (ref 35.0–47.0)
Hemoglobin: 11.5 g/dL — ABNORMAL LOW (ref 12.0–16.0)
MCH: 28.5 pg (ref 26.0–34.0)
MCHC: 34.7 g/dL (ref 32.0–36.0)
MCV: 82.3 fL (ref 80.0–100.0)
PLATELETS: 313 10*3/uL (ref 150–440)
RBC: 4.02 MIL/uL (ref 3.80–5.20)
RDW: 14.7 % — AB (ref 11.5–14.5)
WBC: 14.6 10*3/uL — AB (ref 3.6–11.0)

## 2016-12-11 LAB — TYPE AND SCREEN
ABO/RH(D): O POS
ANTIBODY SCREEN: NEGATIVE

## 2016-12-11 MED ORDER — PRENATAL MULTIVITAMIN CH
1.0000 | ORAL_TABLET | Freq: Every day | ORAL | Status: DC
  Administered 2016-12-11: 1 via ORAL

## 2016-12-11 MED ORDER — MISOPROSTOL 200 MCG PO TABS
ORAL_TABLET | ORAL | Status: AC
  Filled 2016-12-11: qty 4

## 2016-12-11 MED ORDER — ACETAMINOPHEN 325 MG PO TABS: 650.0000 mg | ORAL_TABLET | ORAL | Status: DC | PRN

## 2016-12-11 MED ORDER — LIDOCAINE HCL (PF) 1 % IJ SOLN
30.0000 mL | INTRAMUSCULAR | Status: AC | PRN
  Administered 2016-12-11: 3 mL via SUBCUTANEOUS

## 2016-12-11 MED ORDER — SODIUM CHLORIDE 0.9 % IV SOLN
2.0000 g | Freq: Once | INTRAVENOUS | Status: AC
  Administered 2016-12-11: 2 g via INTRAVENOUS

## 2016-12-11 MED ORDER — OXYTOCIN 40 UNITS IN LACTATED RINGERS INFUSION - SIMPLE MED
2.5000 [IU]/h | INTRAVENOUS | Status: DC
  Administered 2016-12-11: 2.5 [IU]/h via INTRAVENOUS
  Filled 2016-12-11: qty 1000

## 2016-12-11 MED ORDER — LACTATED RINGERS IV SOLN: 500.0000 mL | INTRAVENOUS | Status: DC | PRN

## 2016-12-11 MED ORDER — AMMONIA AROMATIC IN INHA
RESPIRATORY_TRACT | Status: AC
  Filled 2016-12-11: qty 10

## 2016-12-11 MED ORDER — SOD CITRATE-CITRIC ACID 500-334 MG/5ML PO SOLN: 30.0000 mL | ORAL | Status: DC | PRN

## 2016-12-11 MED ORDER — WITCH HAZEL-GLYCERIN EX PADS: 1.0000 "application " | MEDICATED_PAD | CUTANEOUS | Status: DC | PRN

## 2016-12-11 MED ORDER — ONDANSETRON HCL 4 MG/2ML IJ SOLN: 4.0000 mg | INTRAMUSCULAR | Status: DC | PRN

## 2016-12-11 MED ORDER — OXYTOCIN 40 UNITS IN LACTATED RINGERS INFUSION - SIMPLE MED
INTRAVENOUS | Status: AC
  Filled 2016-12-11: qty 1000

## 2016-12-11 MED ORDER — OXYCODONE-ACETAMINOPHEN 5-325 MG PO TABS: 1.0000 | ORAL_TABLET | ORAL | Status: DC | PRN

## 2016-12-11 MED ORDER — MAGNESIUM HYDROXIDE 400 MG/5ML PO SUSP: 30.0000 mL | ORAL | Status: DC | PRN

## 2016-12-11 MED ORDER — LIDOCAINE-EPINEPHRINE (PF) 1.5 %-1:200000 IJ SOLN
INTRAMUSCULAR | Status: DC | PRN
  Administered 2016-12-11: 3 mL via PERINEURAL

## 2016-12-11 MED ORDER — MISOPROSTOL 200 MCG PO TABS
ORAL_TABLET | ORAL | Status: AC
Start: 2016-12-11 — End: 2016-12-11
  Filled 2016-12-11: qty 4

## 2016-12-11 MED ORDER — MEASLES, MUMPS & RUBELLA VAC ~~LOC~~ INJ
0.5000 mL | INJECTION | Freq: Once | SUBCUTANEOUS | Status: DC
  Filled 2016-12-11: qty 0.5

## 2016-12-11 MED ORDER — LIDOCAINE HCL (PF) 1 % IJ SOLN
INTRAMUSCULAR | Status: AC
  Filled 2016-12-11: qty 30

## 2016-12-11 MED ORDER — SODIUM CHLORIDE 0.9 % IV SOLN
INTRAVENOUS | Status: AC
  Administered 2016-12-11: 2 g via INTRAVENOUS
  Filled 2016-12-11: qty 2000

## 2016-12-11 MED ORDER — ONDANSETRON HCL 4 MG PO TABS: 4.0000 mg | ORAL_TABLET | ORAL | Status: DC | PRN

## 2016-12-11 MED ORDER — DIBUCAINE 1 % RE OINT: 1.0000 "application " | TOPICAL_OINTMENT | RECTAL | Status: DC | PRN

## 2016-12-11 MED ORDER — BUPIVACAINE HCL (PF) 0.25 % IJ SOLN
INTRAMUSCULAR | Status: DC | PRN
  Administered 2016-12-11 (×2): 5 mL via EPIDURAL

## 2016-12-11 MED ORDER — OXYTOCIN 10 UNIT/ML IJ SOLN
INTRAMUSCULAR | Status: AC
  Filled 2016-12-11: qty 2

## 2016-12-11 MED ORDER — OXYTOCIN 40 UNITS IN LACTATED RINGERS INFUSION - SIMPLE MED
1.0000 m[IU]/min | INTRAVENOUS | Status: DC
  Administered 2016-12-11: 2 m[IU]/min via INTRAVENOUS

## 2016-12-11 MED ORDER — FERROUS SULFATE 325 (65 FE) MG PO TABS
325.0000 mg | ORAL_TABLET | Freq: Two times a day (BID) | ORAL | Status: DC
  Administered 2016-12-11 – 2016-12-12 (×2): 325 mg via ORAL
  Filled 2016-12-11 (×2): qty 1

## 2016-12-11 MED ORDER — OXYCODONE-ACETAMINOPHEN 5-325 MG PO TABS: 2.0000 | ORAL_TABLET | ORAL | Status: DC | PRN

## 2016-12-11 MED ORDER — SODIUM CHLORIDE 0.9 % IV SOLN
1.0000 g | INTRAVENOUS | Status: DC
  Administered 2016-12-11: 1 g via INTRAVENOUS
  Filled 2016-12-11 (×11): qty 1000

## 2016-12-11 MED ORDER — COCONUT OIL OIL: 1.0000 "application " | TOPICAL_OIL | Status: DC | PRN

## 2016-12-11 MED ORDER — SENNOSIDES-DOCUSATE SODIUM 8.6-50 MG PO TABS
2.0000 | ORAL_TABLET | ORAL | Status: DC
  Administered 2016-12-12: 2 via ORAL
  Filled 2016-12-11: qty 2

## 2016-12-11 MED ORDER — BENZOCAINE-MENTHOL 20-0.5 % EX AERO
1.0000 "application " | INHALATION_SPRAY | CUTANEOUS | Status: DC | PRN
  Administered 2016-12-11: 1 via TOPICAL
  Filled 2016-12-11: qty 56

## 2016-12-11 MED ORDER — ZOLPIDEM TARTRATE 5 MG PO TABS: 5.0000 mg | ORAL_TABLET | Freq: Every evening | ORAL | Status: DC | PRN

## 2016-12-11 MED ORDER — HYDROCODONE-ACETAMINOPHEN 5-325 MG PO TABS
1.0000 | ORAL_TABLET | ORAL | Status: DC | PRN
  Administered 2016-12-11: 1 via ORAL
  Filled 2016-12-11: qty 1

## 2016-12-11 MED ORDER — ONDANSETRON HCL 4 MG/2ML IJ SOLN: 4.0000 mg | Freq: Four times a day (QID) | INTRAMUSCULAR | Status: DC | PRN

## 2016-12-11 MED ORDER — BUTORPHANOL TARTRATE 1 MG/ML IJ SOLN
1.0000 mg | INTRAMUSCULAR | Status: DC | PRN
  Administered 2016-12-11 (×3): 1 mg via INTRAVENOUS
  Filled 2016-12-11 (×2): qty 1

## 2016-12-11 MED ORDER — IBUPROFEN 600 MG PO TABS
600.0000 mg | ORAL_TABLET | Freq: Four times a day (QID) | ORAL | Status: DC
  Administered 2016-12-11 – 2016-12-12 (×4): 600 mg via ORAL
  Filled 2016-12-11 (×4): qty 1

## 2016-12-11 MED ORDER — FENTANYL 2.5 MCG/ML W/ROPIVACAINE 0.15% IN NS 100 ML EPIDURAL (ARMC)
EPIDURAL | Status: DC | PRN
  Administered 2016-12-11: 12 mL/h via EPIDURAL

## 2016-12-11 MED ORDER — OXYTOCIN BOLUS FROM INFUSION
500.0000 mL | Freq: Once | INTRAVENOUS | Status: AC
  Administered 2016-12-11: 500 mL via INTRAVENOUS

## 2016-12-11 MED ORDER — DIPHENHYDRAMINE HCL 25 MG PO CAPS: 25.0000 mg | ORAL_CAPSULE | Freq: Four times a day (QID) | ORAL | Status: DC | PRN

## 2016-12-11 MED ORDER — LACTATED RINGERS IV SOLN
INTRAVENOUS | Status: DC
  Administered 2016-12-11: 01:00:00 via INTRAVENOUS

## 2016-12-11 MED ORDER — SIMETHICONE 80 MG PO CHEW: 80.0000 mg | CHEWABLE_TABLET | ORAL | Status: DC | PRN

## 2016-12-11 MED ORDER — FENTANYL 2.5 MCG/ML W/ROPIVACAINE 0.15% IN NS 100 ML EPIDURAL (ARMC)
EPIDURAL | Status: AC
  Filled 2016-12-11: qty 100

## 2016-12-11 NOTE — H&P (Signed)
OB History & Physical   History of Present Illness:  Chief Complaint:   HPI:  Penny Cortez is a 21 y.o. 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 Obese, BMI 35 3. Social issues 4. H/o asthma 5. GBS positive  Maternal Medical History:   Past Medical History:  Diagnosis Date  . Asthma   . Bronchitis   . GERD (gastroesophageal reflux disease)     Past Surgical History:  Procedure Laterality Date  . NO PAST SURGERIES    . WISDOM TOOTH EXTRACTION      No Known Allergies  Prior to Admission medications   Medication Sig Start Date End Date Taking? Authorizing Provider  acyclovir (ZOVIRAX) 200 MG capsule Take by mouth daily. Patient unsure of dosage   Yes [provider]  Prenatal Vit-Fe Fumarate-FA (PRENATAL MULTIVITAMIN) TABS tablet Take 1 tablet by mouth daily at 12 noon.   Yes [provider]  albuterol (PROVENTIL HFA;VENTOLIN HFA) 108 (90 Base) MCG/ACT inhaler Inhale 2 puffs into the lungs every 6 (six) hours as needed for wheezing or shortness of breath. Patient not taking: Reported on 12/06/2016 07/18/16   Penny DerryWagner, [name], PA-C  azithromycin (ZITHROMAX Z-PAK) 250 MG tablet Take 2 tablets (500 mg) on  Day 1,  followed by 1 tablet (250 mg) once daily on Days 2 through 5. Patient not taking: Reported on 11/17/2016 07/18/16   Penny DerryWagner, [name], PA-C  benzonatate (TESSALON PERLES) 100 MG capsule Take 1 capsule (100 mg total) by mouth 3 (three) times daily as needed for cough. Patient not taking: Reported on 11/17/2016 07/07/16   [name], [name], MD  metroNIDAZOLE (FLAGYL) 500 MG tablet Take 1 tablet (500 mg total) by mouth 2 (two) times daily. Patient not taking: Reported on 11/17/2016 04/23/16   [name], [name], MD  ondansetron (ZOFRAN) 4 MG  tablet Take 1 tablet (4 mg total) by mouth daily as needed for nausea or vomiting. Patient not taking: Reported on 11/17/2016 04/23/16   [name], [name], MD     [facility]  Social History: She  reports that she has quit smoking. Her smoking use included Cigarettes. She smoked 1.00 pack per day. She has never used smokeless tobacco. She reports that she does not drink alcohol or use drugs.  Family History: family history includes Diabetes in her mother; Heart disease in her mother; Kidney disease in her mother.   Review of Systems: A full review of systems was performed and negative except as noted in the HPI.     Physical Exam:  Vital Signs: Temp 97.9 F (36.6 C) (Oral)   Ht 5\' 5"  (1.651 m)   Wt 113.4 kg (250 lb)   LMP 02/29/2016   BMI 41.60 kg/m  General: no acute distress.  HEENT: normocephalic, atraumatic Heart: regular rate & rhythm.  No murmurs/rubs/gallops Lungs: clear to auscultation bilaterally, normal respiratory effort Abdomen: soft, gravid, non-tender;  EFW: 7.3 Pelvic:   External: Normal external female genitalia, no lesions SSE: no lesions at cervix or vaginal walls  Cervix: Dilation: 4 / Effacement (%): 100 / Station: -2    Extremities: non-tender, symmetric, 1+ edema bilaterally.  DTRs: 2+ Neurologic: Alert & oriented x 3.    No results found for this or any previous visit (from the  past 24 hour(s)).  Pertinent Results:  Prenatal Labs: Blood type/Rh O+  Antibody screen neg  Rubella Immune  Varicella Immune  RPR NR  HBsAg Neg  HIV NR  GC neg  Chlamydia neg  Genetic screening negative  1 hour GTT 114, 134  3 hour GTT n/a  GBS positive   FHT:130 mod +accels no decels TOCO: q3-59min SVE:  Dilation: 4 / Effacement (%): 100 / Station: -2    Cephalic by leopolds   Assessment:  Penny Cortez is a 21 y.o. G1P0 female at [redacted]w[redacted]d with labor, GBS+  Plan:  1. Admit to Labor & Delivery 2. CBC, T&S, Clrs, IVF 3. GBS  positive, ampicillin  4. Consents obtained. 5. Continuous efm/toco 6.  no HSV lesions noted, may attempt SVD  7. Expectant management  ----- Ranae Plumber, MD Attending Obstetrician and Gynecologist [facility], Department of OB/GYN [facility]

## 2016-12-11 NOTE — Anesthesia Preprocedure Evaluation (Signed)
Anesthesia Evaluation  Patient identified by MRN, date of birth, ID band Patient awake    Reviewed: Allergy & Precautions, H&P , NPO status , Patient's Chart, lab work & pertinent test results  History of Anesthesia Complications Negative for: history of anesthetic complications  Airway Mallampati: II  TM Distance: >3 FB Neck ROM: full    Dental no notable dental hx.    Pulmonary neg pulmonary ROS, former smoker,    Pulmonary exam normal        Cardiovascular negative cardio ROS Normal cardiovascular exam     Neuro/Psych negative neurological ROS  negative psych ROS   GI/Hepatic negative GI ROS, Neg liver ROS,   Endo/Other  negative endocrine ROS  Renal/GU negative Renal ROS  negative genitourinary   Musculoskeletal   Abdominal   Peds  Hematology negative hematology ROS (+)   Anesthesia Other Findings   Reproductive/Obstetrics (+) Pregnancy                             Anesthesia Physical Anesthesia Plan  ASA: II  Anesthesia Plan: Epidural   Post-op Pain Management:    Induction:   PONV Risk Score and Plan:   Airway Management Planned:   Additional Equipment:   Intra-op Plan:   Post-operative Plan:   Informed Consent: I have reviewed the patients History and Physical, chart, labs and discussed the procedure including the risks, benefits and alternatives for the proposed anesthesia with the patient or authorized representative who has indicated his/her understanding and acceptance.     Plan Discussed with: CRNA and Anesthesiologist  Anesthesia Plan Comments:         Anesthesia Quick Evaluation

## 2016-12-11 NOTE — Lactation Note (Signed)
This note was copied from a baby's chart. Lactation Consultation Note  Patient Name: Penny Enid DerryJennifer Funderburg ZOXWR'UToday's Date: 12/11/2016 Reason for consult: Follow-up assessment   Maternal Data Formula Feeding for Exclusion: Yes Reason for exclusion: Mother's choice to formula and breast feed on admission Mom has decided she only wants to bottlefeed formula after counseling by myself and her RN, was unsure before she delivered, encouraged her to call LC if she changes her mind and wants to try breastfeeding again Feeding Feeding Type: Bottle Fed - Formula Nipple Type: Slow - flow  LATCH Score/Interventions                      Lactation Tools Discussed/Used WIC Program: Yes   Consult Status Consult Status: PRN    Dyann KiefMarsha D Aroldo Galli 12/11/2016, 4:56 PM

## 2016-12-11 NOTE — Progress Notes (Signed)
Pt transferred to LDR1 for admission

## 2016-12-11 NOTE — Progress Notes (Signed)
Dr Elesa MassedWard notified of cervical change. Orders received for admission to L&D.

## 2016-12-11 NOTE — Anesthesia Procedure Notes (Addendum)
Epidural Patient location during procedure: OB Start time: 12/11/2016 8:00 AM End time: 12/11/2016 8:35 AM  Staffing Resident/CRNA: Junious SilkNOLES, Milt Coye Performed: resident/CRNA   Preanesthetic Checklist Completed: patient identified, site marked, surgical consent, pre-op evaluation, timeout performed, IV checked, risks and benefits discussed and monitors and equipment checked  Epidural Patient position: sitting Prep: Betadine Patient monitoring: heart rate, continuous pulse ox and blood pressure Approach: midline Location: L3-L4 Injection technique: LOR air  Needle:  Needle type: Tuohy  Needle gauge: 17 G Needle length: 9 cm and 9 Needle insertion depth: 9 cm Catheter type: closed end flexible Catheter size: 20 Guage Catheter at skin depth: 14 cm Test dose: negative and 1.5% lidocaine with Epi 1:200 K  Assessment Sensory level: T10 Events: blood not aspirated, injection not painful, no injection resistance, negative IV test and no paresthesia  Additional Notes   Patient tolerated the insertion well without complications.Reason for block:procedure for pain

## 2016-12-11 NOTE — Progress Notes (Signed)
Pt questioning getting epidural and wanting her cervix checked. SVE per RN. No cervical change. Pt requests medication but no epidural at this time.

## 2016-12-12 LAB — CBC
HCT: 31.3 % — ABNORMAL LOW (ref 35.0–47.0)
HEMOGLOBIN: 10.5 g/dL — AB (ref 12.0–16.0)
MCH: 28.6 pg (ref 26.0–34.0)
MCHC: 33.7 g/dL (ref 32.0–36.0)
MCV: 84.9 fL (ref 80.0–100.0)
PLATELETS: 254 10*3/uL (ref 150–440)
RBC: 3.69 MIL/uL — AB (ref 3.80–5.20)
RDW: 15.1 % — ABNORMAL HIGH (ref 11.5–14.5)
WBC: 9.9 10*3/uL (ref 3.6–11.0)

## 2016-12-12 MED ORDER — HYDROCODONE-ACETAMINOPHEN 5-325 MG PO TABS: 1.0000 | ORAL_TABLET | ORAL | 0 refills | Status: DC | PRN

## 2016-12-12 MED ORDER — BENZOCAINE-MENTHOL 20-0.5 % EX AERO: 1.0000 "application " | INHALATION_SPRAY | Freq: Four times a day (QID) | CUTANEOUS | 1 refills | Status: DC | PRN

## 2016-12-12 MED ORDER — IBUPROFEN 600 MG PO TABS: 600.0000 mg | ORAL_TABLET | Freq: Four times a day (QID) | ORAL | 0 refills | Status: DC

## 2016-12-12 NOTE — Clinical Social Work Note (Signed)
CSW spoke with patient's attending nurse about the patient's needs. CSW has provided an [facility] [place] Resource list via tube to expedite the discharge for this patient. CSW will continue to follow should any additional needs arise.  Argentina PonderKaren Martha Sahana Boyland, MSW, Theresia MajorsLCSWA [phone]

## 2016-12-12 NOTE — Discharge Summary (Signed)
Obstetric Discharge Summary Reason for Admission: onset of labor Prenatal Procedures: none Intrapartum Procedures: spontaneous vaginal delivery Postpartum Procedures: none Complications-Operative and Postpartum: none Hemoglobin  Date Value Ref Range Status  12/12/2016 10.5 (L) 12.0 - 16.0 g/dL Final   HCT  Date Value Ref Range Status  12/12/2016 31.3 (L) 35.0 - 47.0 % Final    Physical Exam:  General: alert and cooperative Lochia: appropriate Uterine Fundus: firm Incision: healing well DVT Evaluation: No evidence of DVT seen on physical exam.  Discharge Diagnoses: Term Pregnancy-delivered  Discharge Information: Date: 12/12/2016 Activity: pelvic rest Diet: routine Medications: Ibuprofen and Vicodin Condition: stable Instructions: refer to practice specific booklet Discharge to: home Follow-up Information    Schermerhorn, Ihor Austinhomas J, MD Follow up in 6 week(s).   Specialty:  Obstetrics and Gynecology Contact information: [address] [facility] [place] [address] [phone]           Newborn Data: Live born female  Birth Weight: 7 lb 10.1 oz (3460 g) APGAR: ,   Home with mother. Mother will get DEpo Provera at [facility]  Suzy Bouchardhomas J Schermerhorn 12/12/2016, 11:43 AM

## 2016-12-12 NOTE — Progress Notes (Signed)
Discharge instructions complete and prescriptions given. Patient verbalizes understanding of teaching. Patient given social work, [place]. Patient discharged home at [phone].

## 2016-12-14 NOTE — Anesthesia Postprocedure Evaluation (Signed)
Anesthesia Post Note  Patient: Penny Cortez  Procedure(s) Performed: * No procedures listed *  Patient location during evaluation: Mother Baby Anesthesia Type: Epidural Level of consciousness: awake and alert and oriented Pain management: pain level controlled Vital Signs Assessment: post-procedure vital signs reviewed and stable Respiratory status: spontaneous breathing, nonlabored ventilation and respiratory function stable Cardiovascular status: stable Postop Assessment: no headache, no backache, epidural receding and no signs of nausea or vomiting (no pruritis) Anesthetic complications: no     Last Vitals: There were no vitals filed for this visit.  Last Pain: There were no vitals filed for this visit.               Cathlene Gardella

## 2016-12-17 LAB — HEPATITIS B SURFACE ANTIBODY, QUANTITATIVE

## 2016-12-17 LAB — RPR: RPR: NONREACTIVE

## 2018-07-04 IMAGING — US US ABDOMEN LIMITED
1 series · 14 of 25 positions shown · non-contrast
Comparison: Ultrasound 04/26/2014

CLINICAL DATA: Right upper quadrant pain and nausea. Symptoms for 1
day.

EXAM:
US ABDOMEN LIMITED - RIGHT UPPER QUADRANT

[Series 1: us abdomen limited · 0.19mm/px · 14 of 48 slices shown]
[im 1/48]
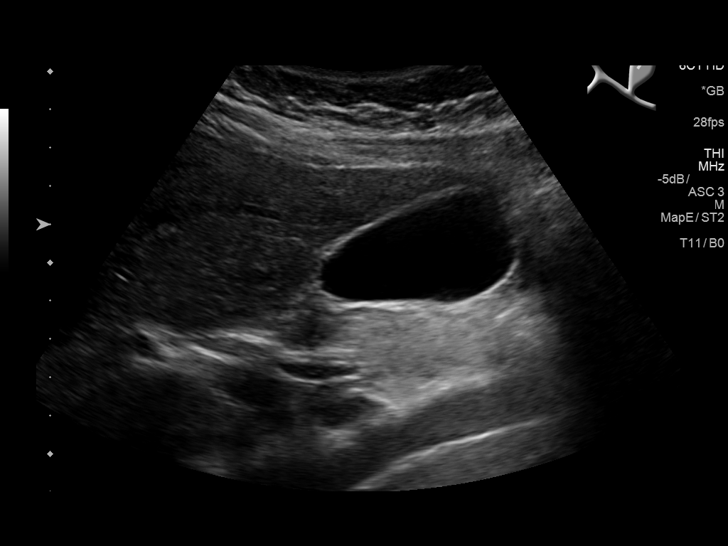
[im 4/48]
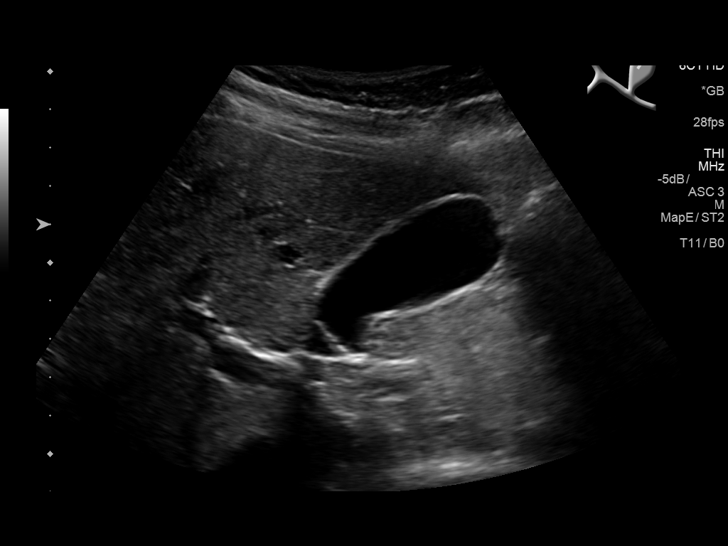
[im 8/48]
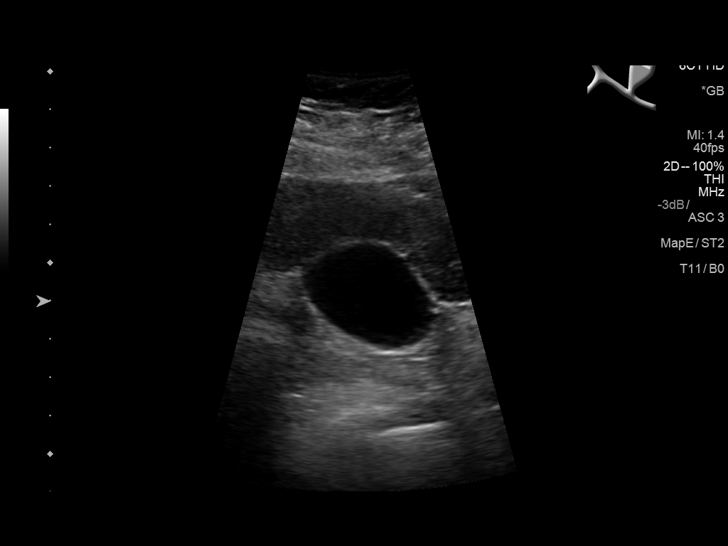
[im 12/48]
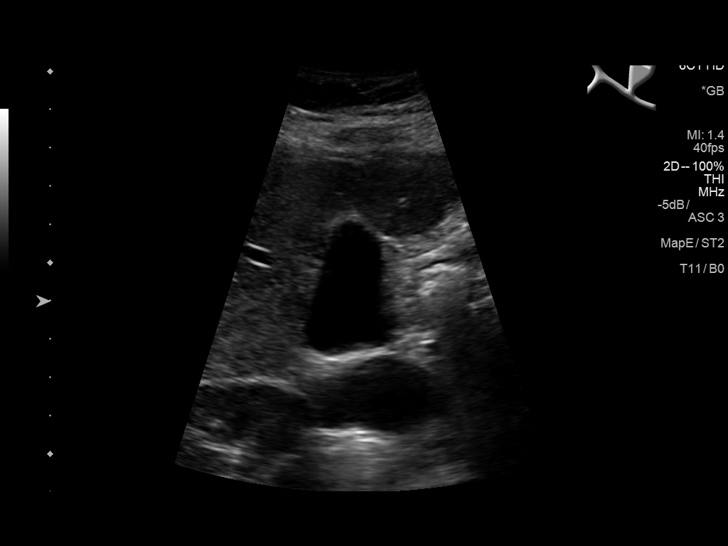
[im 16/48]
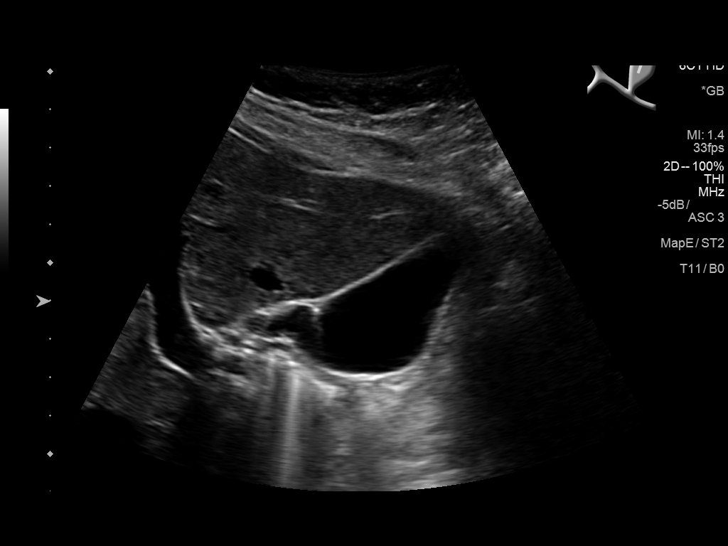
[im 18/48]
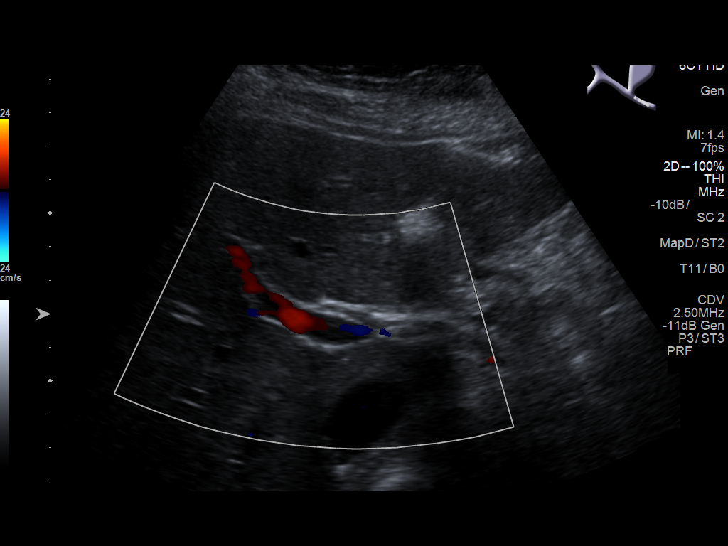
[im 22/48]
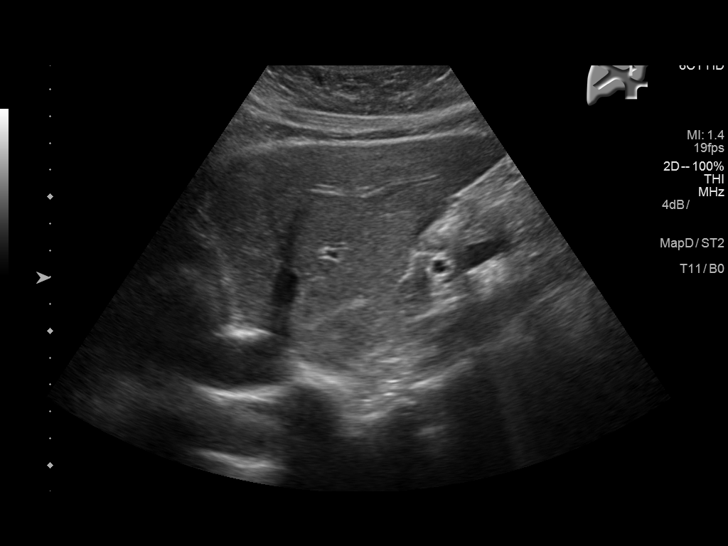
[im 26/48]
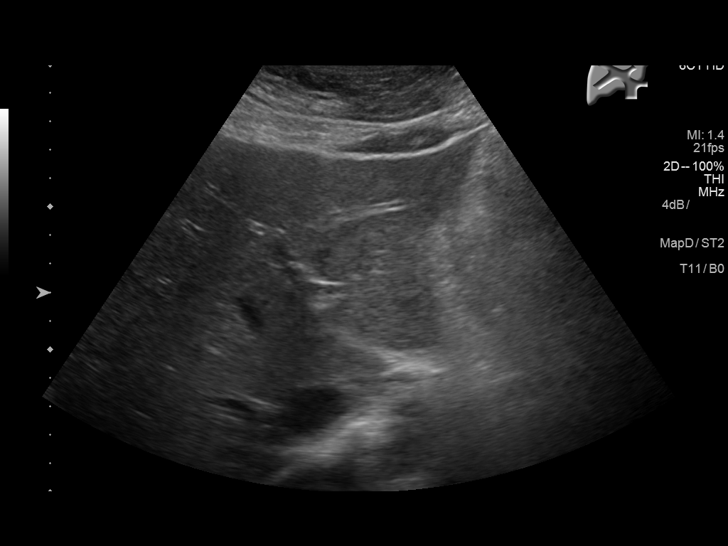
[im 30/48]
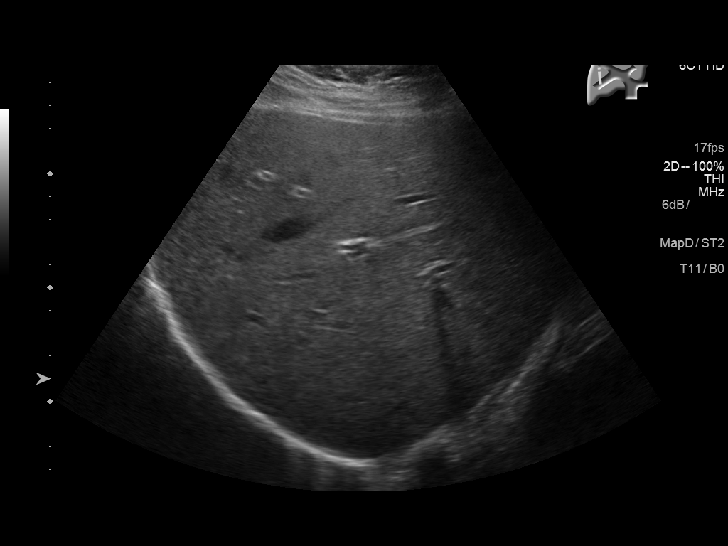
[im 32/48]
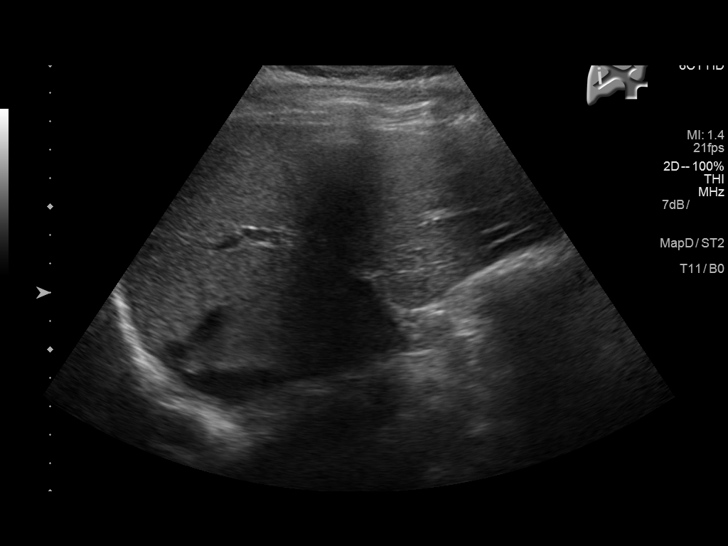
[im 36/48]
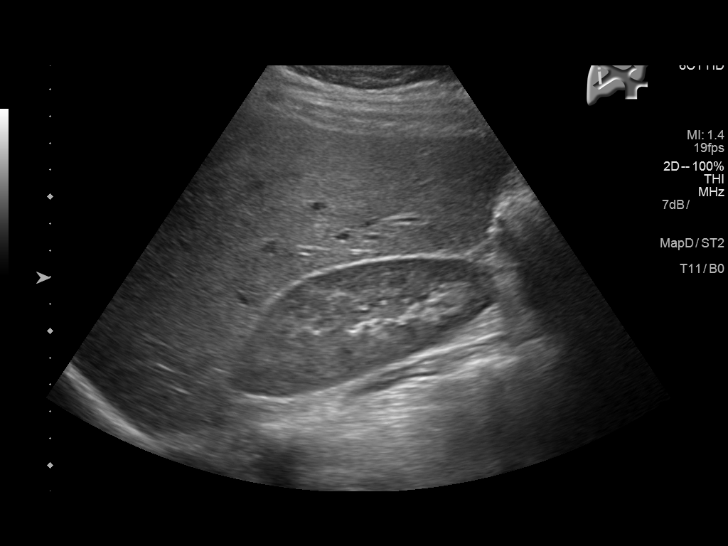
[im 40/48]
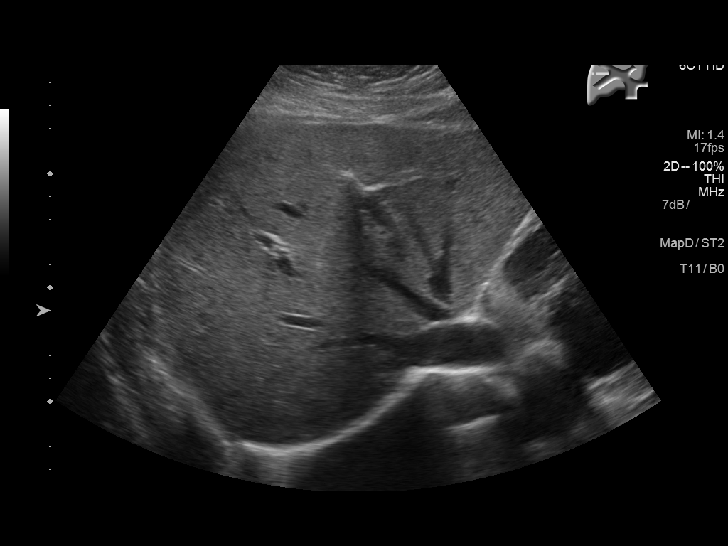
[im 44/48]
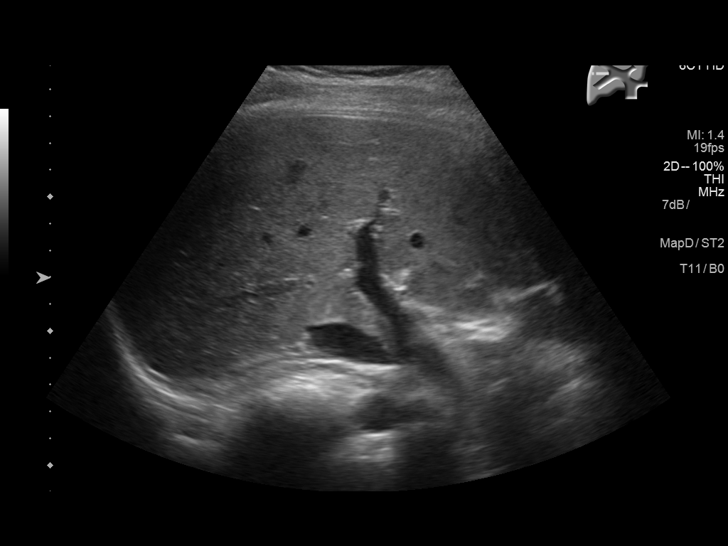
[im 48/48]
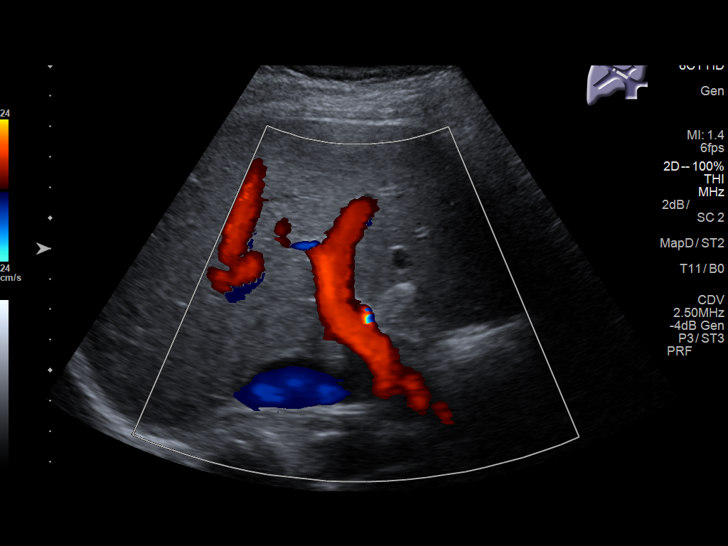

[14 of 25 positions shown; findings below may reference images not displayed]

FINDINGS: Gallbladder:

Physiologically distended. No gallstones or wall thickening
visualized. No sonographic Murphy sign noted by sonographer.

Common bile duct:

Diameter: 2.8 mm.  Normal.

Liver:

No focal lesion identified. Within normal limits in parenchymal
echogenicity. Normal directional flow in the main portal vein.
IMPRESSION: Normal right upper quadrant ultrasound.

## 2018-12-20 ENCOUNTER — Other Ambulatory Visit: Payer: Self-pay

## 2018-12-20 ENCOUNTER — Encounter: Payer: Self-pay | Admitting: Emergency Medicine

## 2018-12-20 ENCOUNTER — Emergency Department
Admission: EM | Admit: 2018-12-20 | Discharge: 2018-12-20 | Disposition: A | Discharge status: Home / Self Care | Payer: Self-pay | Attending: Emergency Medicine | Admitting: Emergency Medicine

## 2018-12-20 DIAGNOSIS — Z5321 Procedure and treatment not carried out due to patient leaving prior to being seen by health care provider: Secondary | ICD-10-CM | POA: Insufficient documentation

## 2018-12-20 DIAGNOSIS — N898 Other specified noninflammatory disorders of vagina: Secondary | ICD-10-CM | POA: Insufficient documentation

## 2018-12-20 DIAGNOSIS — M545 Low back pain: Secondary | ICD-10-CM | POA: Insufficient documentation

## 2018-12-20 DIAGNOSIS — R1030 Lower abdominal pain, unspecified: Secondary | ICD-10-CM | POA: Insufficient documentation

## 2018-12-20 DIAGNOSIS — N941 Unspecified dyspareunia: Secondary | ICD-10-CM | POA: Insufficient documentation

## 2018-12-20 LAB — COMPREHENSIVE METABOLIC PANEL
ALT: 14 U/L (ref 0–44)
AST: 16 U/L (ref 15–41)
Albumin: 4.5 g/dL (ref 3.5–5.0)
Alkaline Phosphatase: 71 U/L (ref 38–126)
Anion gap: 9 (ref 5–15)
BUN: 10 mg/dL (ref 6–20)
CO2: 24 mmol/L (ref 22–32)
Calcium: 9.8 mg/dL (ref 8.9–10.3)
Chloride: 105 mmol/L (ref 98–111)
Creatinine, Ser: 0.61 mg/dL (ref 0.44–1.00)
GFR calc Af Amer: >60 mL/min (ref >60)
GFR calc non Af Amer: >60 mL/min (ref >60)
Glucose, Bld: 87 mg/dL (ref 70–99)
Potassium: 4 mmol/L (ref 3.5–5.1)
Sodium: 138 mmol/L (ref 135–145)
Total Bilirubin: 1 mg/dL (ref 0.3–1.2)
Total Protein: 8.1 g/dL (ref 6.5–8.1)

## 2018-12-20 LAB — URINALYSIS, COMPLETE (UACMP) WITH MICROSCOPIC
Bacteria, UA: NONE SEEN
Bilirubin Urine: NEGATIVE
Glucose, UA: NEGATIVE mg/dL
Hgb urine dipstick: NEGATIVE
Ketones, ur: NEGATIVE mg/dL
Leukocytes,Ua: NEGATIVE
Nitrite: NEGATIVE
Protein, ur: NEGATIVE mg/dL
Specific Gravity, Urine: 1.024 (ref 1.005–1.030)
pH: 6 (ref 5.0–8.0)

## 2018-12-20 LAB — CBC
HCT: 44.2 % (ref 36.0–46.0)
Hemoglobin: 15 g/dL (ref 12.0–15.0)
MCH: 29.6 pg (ref 26.0–34.0)
MCHC: 33.9 g/dL (ref 30.0–36.0)
MCV: 87.4 fL (ref 80.0–100.0)
Platelets: 288 10*3/uL (ref 150–400)
RBC: 5.06 MIL/uL (ref 3.87–5.11)
RDW: 13 % (ref 11.5–15.5)
WBC: 7.7 10*3/uL (ref 4.0–10.5)
nRBC: 0 % (ref 0.0–0.2)

## 2018-12-20 LAB — POCT PREGNANCY, URINE: Preg Test, Ur: NEGATIVE

## 2018-12-20 LAB — LIPASE, BLOOD: Lipase: 22 U/L (ref 11–51)

## 2018-12-20 MED ORDER — SODIUM CHLORIDE 0.9% FLUSH: 3.0000 mL | Freq: Once | INTRAVENOUS | Status: DC

## 2018-12-20 NOTE — ED Provider Notes (Signed)
Patient eloped prior to my evaluation   Nance Pear, MD 12/20/18 [phone]

## 2018-12-20 NOTE — ED Notes (Signed)
Reports abd pain x 1 week pain radiates into her lower back. Lab and urine resulted. Awaiting md eval and plan of care.

## 2018-12-20 NOTE — ED Triage Notes (Signed)
Says pain across low abd and low back--like getting monthly, but says she also has been having pain during sex and vaginal discharge  About a week or 2.

## 2018-12-22 ENCOUNTER — Telehealth: Payer: Self-pay | Admitting: Emergency Medicine

## 2018-12-22 NOTE — Telephone Encounter (Signed)
Called patient due to lwot to inquire about condition and follow up plans.  No answer and no voicemail  

## 2019-01-18 ENCOUNTER — Ambulatory Visit (INDEPENDENT_AMBULATORY_CARE_PROVIDER_SITE_OTHER): Payer: Self-pay | Admitting: Obstetrics and Gynecology

## 2019-01-18 ENCOUNTER — Encounter: Payer: Self-pay | Admitting: Obstetrics and Gynecology

## 2019-01-18 ENCOUNTER — Other Ambulatory Visit (HOSPITAL_COMMUNITY)
Admission: RE | Admit: 2019-01-18 | Discharge: 2019-01-18 | Disposition: A | Discharge status: Home / Self Care | Payer: Medicaid Other | Source: Ambulatory Visit | Attending: Obstetrics and Gynecology | Admitting: Obstetrics and Gynecology

## 2019-01-18 ENCOUNTER — Other Ambulatory Visit: Payer: Self-pay

## 2019-01-18 VITALS — BP 112/72 | Wt 203.0 lb

## 2019-01-18 DIAGNOSIS — Z3481 Encounter for supervision of other normal pregnancy, first trimester: Secondary | ICD-10-CM | POA: Diagnosis not present

## 2019-01-18 DIAGNOSIS — Z3490 Encounter for supervision of normal pregnancy, unspecified, unspecified trimester: Secondary | ICD-10-CM | POA: Insufficient documentation

## 2019-01-18 DIAGNOSIS — O98511 Other viral diseases complicating pregnancy, first trimester: Secondary | ICD-10-CM | POA: Insufficient documentation

## 2019-01-18 DIAGNOSIS — Z3A01 Less than 8 weeks gestation of pregnancy: Secondary | ICD-10-CM

## 2019-01-18 DIAGNOSIS — Z113 Encounter for screening for infections with a predominantly sexual mode of transmission: Secondary | ICD-10-CM

## 2019-01-18 DIAGNOSIS — Z6834 Body mass index (BMI) 34.0-34.9, adult: Secondary | ICD-10-CM

## 2019-01-18 DIAGNOSIS — O99211 Obesity complicating pregnancy, first trimester: Secondary | ICD-10-CM

## 2019-01-18 DIAGNOSIS — Z124 Encounter for screening for malignant neoplasm of cervix: Secondary | ICD-10-CM | POA: Diagnosis present

## 2019-01-18 DIAGNOSIS — B009 Herpesviral infection, unspecified: Secondary | ICD-10-CM

## 2019-01-18 NOTE — Progress Notes (Signed)
New Obstetric Patient H&P   Chief Complaint: "Desires prenatal care"  History of Present Illness: Patient is a 23 y.o. G2P1001 Not Hispanic or Latino female, estimated LMP 12/07/2018 presents with amenorrhea and positive home pregnancy test. Based on her  LMP, her EDD is Estimated Date of Delivery: 09/13/19 and her EGA is [redacted]w[redacted]d. Her periods are slightly irregular. They normally last 2-3 days. Her last pap smear was reportedly normal.     She had a urine pregnancy test which was positive 2 week(s)  ago. Her last menstrual period was normal and lasted for  2 week(s). Since her LMP she claims she has experienced no issues. She denies vaginal bleeding. Her past medical history is noncontributory. Her prior pregnancies are notable for no issues.  No issues with delivery.   Since her LMP, she admits to the use of tobacco products  Quit with pregnancy test positive.  She claims she has gained a few pounds since the start of her pregnancy.  There are cats in the home in the home  yes If yes Outdoor She admits close contact with children on a regular basis  no  She has had chicken pox in the past yes She has had Tuberculosis exposures, symptoms, or previously tested positive for TB   no Current or past history of domestic violence. no  Genetic Screening/Teratology Counseling: (Includes patient, baby's father, or anyone in either family with:)   1. Patient's age >/= 4 at [facility]  no 2. Thalassemia ([place], [place], Mediterranean, or Asian background): MCV<80  no 3. Neural tube defect (meningomyelocele, spina bifida, anencephaly)  no 4. Congenital heart defect  no  5. Down syndrome  no 6. Tay-Sachs (Jewish, [place]  no 7. Canavan's Disease  no 8. Sickle cell disease or trait (African)  no  9. Hemophilia or other blood disorders  no  10. Muscular dystrophy  no  11. Cystic fibrosis  no  12. Huntington's Chorea  no  13. Mental retardation/autism  no 14. Other inherited genetic or chromosomal  disorder  no 15. Maternal metabolic disorder (DM, PKU, etc)  no 16. Patient or FOB with a child with a birth defect not listed above no  16a. Patient or FOB with a birth defect themselves no 17. Recurrent pregnancy loss, or stillbirth  no  18. Any medications since LMP other than prenatal vitamins (include vitamins, supplements, OTC meds, drugs, alcohol)  PNV 19. Any other genetic/environmental exposure to discuss  no  Infection History:   1. Lives with someone with TB or TB exposed  no  2. Patient or partner has history of genital herpes  yes 3. Rash or viral illness since LMP  no 4. History of STI (GC, CT, HPV, syphilis, HIV)  Chlamydia noted at [facility]. She got treated.   5. History of recent travel :  no  Other pertinent information:  no   Review of Systems:10 point review of systems negative unless otherwise noted in HPI  Past Medical History:  Diagnosis Date  . Asthma   . Bronchitis   . GERD (gastroesophageal reflux disease)     Past Surgical History:  Procedure Laterality Date  . NO PAST SURGERIES    . WISDOM TOOTH EXTRACTION      Gynecologic History: Patient's last menstrual period was 12/07/2018 (within days).  Obstetric History: G2P1001, s/p SVD x 1.  7lb 10 oz.  Healthy female, 12/19/2016 delivered by [facility].   Family History  Problem Relation Age of Onset  . Heart disease Mother   .  Diabetes Mother   . Kidney disease Mother     Social History   Socioeconomic History  . Marital status: Single    Spouse name: Not on file  . Number of children: Not on file  . Years of education: Not on file  . Highest education level: Not on file  Occupational History  . Not on file  Social Needs  . Financial resource strain: Not on file  . Food insecurity    Worry: Not on file    Inability: Not on file  . Transportation needs    Medical: Not on file    Non-medical: Not on file  Tobacco Use  . Smoking status: Current Every Day Smoker    Packs/day: 1.00    Types:  Cigarettes  . Smokeless tobacco: Never Used  Substance and Sexual Activity  . Alcohol use: No  . Drug use: No  . Sexual activity: Yes    Birth control/protection: Injection  Lifestyle  . Physical activity    Days per week: Not on file    Minutes per session: Not on file  . Stress: Not on file  Relationships  . Social Musicianconnections    Talks on phone: Not on file    Gets together: Not on file    Attends religious service: Not on file    Active member of club or organization: Not on file    Attends meetings of clubs or organizations: Not on file    Relationship status: Not on file  . Intimate partner violence    Fear of current or ex partner: Not on file    Emotionally abused: Not on file    Physically abused: Not on file    Forced sexual activity: Not on file  Other Topics Concern  . Not on file  Social History Narrative  . Not on file   Allergies: No Known Allergies  Prior to Admission medications   Medication Sig Start Date End Date Taking? Authorizing Provider  Prenatal Vit-Fe Fumarate-FA (PNV PRENATAL PLUS MULTIVITAMIN) 27-1 MG TABS Take by mouth.   Yes [provider]    Physical Exam BP 112/72   Wt 203 lb (92.1 kg)   LMP 12/07/2018 (Within Days)   BMI 34.84 kg/m   Physical Exam Constitutional:      General: She is not in acute distress.    Appearance: Normal appearance.  HENT:     Head: Normocephalic and atraumatic.  Eyes:     General: No scleral icterus.    Conjunctiva/sclera: Conjunctivae normal.  Neck:     Musculoskeletal: Normal range of motion and neck supple. No neck rigidity.  Cardiovascular:     Rate and Rhythm: Normal rate and regular rhythm.     Heart sounds: No murmur. No friction rub. No gallop.   Pulmonary:     Effort: Pulmonary effort is normal.     Breath sounds: Normal breath sounds. No wheezing, rhonchi or rales.  Abdominal:     General: Bowel sounds are normal. There is no distension.     Palpations: Abdomen is soft. There is  no mass.     Tenderness: There is no abdominal tenderness. There is no guarding.  Musculoskeletal: Normal range of motion.        General: No swelling.  Skin:    General: Skin is warm and dry.     Coloration: Skin is not jaundiced.  Neurological:     General: No focal deficit present.     Mental Status: She  is alert and oriented to person, place, and time.     Cranial Nerves: No cranial nerve deficit.  Psychiatric:        Mood and Affect: Mood normal.        Behavior: Behavior normal.        Judgment: Judgment normal.   Female Chaperone present during breast and/or pelvic exam.  Assessment: 23 y.o. G2P1001 at [redacted]w[redacted]d presenting to initiate prenatal care  Plan: 1) Avoid alcoholic beverages. 2) Patient encouraged not to smoke.  3) Discontinue the use of all non-medicinal drugs and chemicals.  4) Take prenatal vitamins daily.  5) Nutrition, food safety (fish, cheese advisories, and high nitrite foods) and exercise discussed. 6) Hospital and practice style discussed with cross coverage system.  7) Genetic Screening, such as with 1st Trimester Screening, cell free fetal DNA, AFP testing, and Ultrasound, as well as with amniocentesis and CVS as appropriate, is discussed with patient. At the conclusion of today's visit patient undecided genetic testing 8) Patient is asked about travel to areas at risk for the [place] virus, and counseled to avoid travel and exposure to mosquitoes or sexual partners who may have themselves been exposed to the virus. Testing is discussed, and will be ordered as appropriate.   Patient does not wish to deliver at [facility].  She states that she had a bad experience there with her mother passing away late last year.  She delivered her first child there a couple of years ago and states that she had a good experience.  I discussed with her that it is more ideal to get prenatal care at the place where delivery will occur.  However, if she desires to  have her prenatal care with [place], will be happy to provide prenatal care and she could consider delivering elsewhere.  We discussed some of the logistical challenges with this approach and how it is not ideal for the other institution to not have access to her records in all cases.  She voiced understanding.  Prentice Docker, MD 01/18/2019 11:46 AM

## 2019-01-20 LAB — CERVICOVAGINAL ANCILLARY ONLY
Chlamydia: NEGATIVE
Neisseria Gonorrhea: NEGATIVE
Trichomonas: NEGATIVE

## 2019-01-23 LAB — URINE DRUG PANEL 7
Amphetamines, Urine: NEGATIVE ng/mL
Barbiturate Quant, Ur: NEGATIVE ng/mL
Benzodiazepine Quant, Ur: NEGATIVE ng/mL
Cannabinoid Quant, Ur: POSITIVE — AB
Cocaine (Metab.): NEGATIVE ng/mL
Opiate Quant, Ur: NEGATIVE ng/mL
PCP Quant, Ur: NEGATIVE ng/mL

## 2019-01-23 LAB — URINE CULTURE

## 2019-01-24 LAB — CYTOLOGY - PAP

## 2019-02-15 ENCOUNTER — Other Ambulatory Visit: Payer: Self-pay

## 2019-02-15 ENCOUNTER — Encounter: Payer: Self-pay | Admitting: Emergency Medicine

## 2019-02-15 ENCOUNTER — Ambulatory Visit
Admission: EM | Admit: 2019-02-15 | Discharge: 2019-02-15 | Disposition: A | Discharge status: Home / Self Care | Payer: Medicaid Other | Attending: Internal Medicine | Admitting: Internal Medicine

## 2019-02-15 DIAGNOSIS — Z87891 Personal history of nicotine dependence: Secondary | ICD-10-CM | POA: Insufficient documentation

## 2019-02-15 DIAGNOSIS — J029 Acute pharyngitis, unspecified: Secondary | ICD-10-CM | POA: Diagnosis not present

## 2019-02-15 DIAGNOSIS — J45909 Unspecified asthma, uncomplicated: Secondary | ICD-10-CM | POA: Diagnosis not present

## 2019-02-15 DIAGNOSIS — Z20828 Contact with and (suspected) exposure to other viral communicable diseases: Secondary | ICD-10-CM | POA: Insufficient documentation

## 2019-02-15 DIAGNOSIS — R05 Cough: Secondary | ICD-10-CM | POA: Diagnosis not present

## 2019-02-15 DIAGNOSIS — R5383 Other fatigue: Secondary | ICD-10-CM | POA: Diagnosis not present

## 2019-02-15 DIAGNOSIS — R0981 Nasal congestion: Secondary | ICD-10-CM | POA: Insufficient documentation

## 2019-02-15 DIAGNOSIS — E669 Obesity, unspecified: Secondary | ICD-10-CM | POA: Diagnosis not present

## 2019-02-15 DIAGNOSIS — R6889 Other general symptoms and signs: Secondary | ICD-10-CM | POA: Insufficient documentation

## 2019-02-15 DIAGNOSIS — Z6834 Body mass index (BMI) 34.0-34.9, adult: Secondary | ICD-10-CM | POA: Diagnosis not present

## 2019-02-15 MED ORDER — DIPHENHYDRAMINE HCL 25 MG PO TABS: 25.0000 mg | ORAL_TABLET | Freq: Four times a day (QID) | ORAL | 0 refills | Status: AC | PRN

## 2019-02-15 NOTE — ED Provider Notes (Signed)
[facility]    CSN: [id number] Arrival date & time: 02/15/19  1405      History   Chief Complaint Chief Complaint  Patient presents with  . Cough  . Nasal Congestion    HPI Penny Cortez is a 22 y.o. female with a history of asthma currently controlled and gastroesophageal reflux disease comes to urgent care with complaints of nasal congestion, nonproductive cough and sore throat for few days duration.  Patient's family members have been exhibiting similar symptoms prior to the onset of her symptoms.  She denies any fever or chills.  No shortness of breath, wheezing or sputum production.  She denies any generalized body aches.  She has been experiencing nausea since she found out she was pregnant and that has not changed.  No dysuria, urgency or frequency. Past Medical History:  Diagnosis Date  . Asthma   . Bronchitis   . GERD (gastroesophageal reflux disease)     Patient Active Problem List   Diagnosis Date Noted  . Supervision of normal pregnancy 01/18/2019  . Herpes virus infection in mother during first trimester of pregnancy 01/18/2019  . Obesity affecting pregnancy in first trimester 01/18/2019  . BMI 34.0-34.9,adult 01/18/2019    Past Surgical History:  Procedure Laterality Date  . NO PAST SURGERIES    . WISDOM TOOTH EXTRACTION      OB History    Gravida  2   Para  1   Term  1   Preterm      AB      Living  1     SAB      TAB      Ectopic      Multiple  0   Live Births  1            Home Medications    Prior to Admission medications   Medication Sig Start Date End Date Taking? Authorizing Provider  Prenatal Vit-Fe Fumarate-FA (PNV PRENATAL PLUS MULTIVITAMIN) 27-1 MG TABS Take by mouth.   Yes [provider]  diphenhydrAMINE (BENADRYL) 25 MG tablet Take 1 tablet (25 mg total) by mouth every 6 (six) hours as needed (cough). 02/15/19   LampteyBritta Mccreedy, MD    Family History Family History  Problem Relation Age  of Onset  . Heart disease Mother   . Diabetes Mother   . Kidney disease Mother     Social History Social History   Tobacco Use  . Smoking status: Former Smoker    Packs/day: 1.00    Types: Cigarettes  . Smokeless tobacco: Never Used  Substance Use Topics  . Alcohol use: No  . Drug use: No     Allergies   Patient has no known allergies.   Review of Systems Review of Systems  Constitutional: Positive for activity change and fatigue. Negative for chills and fever.  HENT: Positive for congestion, rhinorrhea and sore throat. Negative for ear discharge, ear pain, hearing loss, nosebleeds, postnasal drip, sinus pressure, sinus pain and sneezing.   Respiratory: Positive for cough. Negative for chest tightness, shortness of breath and wheezing.   Cardiovascular: Negative.   Gastrointestinal: Negative.   Genitourinary: Negative.   Musculoskeletal: Positive for myalgias. Negative for joint swelling and neck stiffness.  Skin: Negative.   Neurological: Negative for dizziness, weakness, light-headedness and numbness.     Physical Exam Triage Vital Signs ED Triage Vitals  Enc Vitals Group     BP 02/15/19 1421 110/71     Pulse Rate  02/15/19 1421 76     Resp 02/15/19 1421 18     Temp 02/15/19 1421 98 F (36.7 C)     Temp Source 02/15/19 1421 Oral     SpO2 02/15/19 1421 98 %     Weight 02/15/19 1419 190 lb (86.2 kg)     Height 02/15/19 1419 5\' 4"  (1.626 m)     Head Circumference --      Peak Flow --      Pain Score 02/15/19 1419 0     Pain Loc --      Pain Edu? --      Excl. in [place]? --    No data found.  Updated Vital Signs BP 110/71 (BP Location: Right Arm)   Pulse 76   Temp 98 F (36.7 C) (Oral)   Resp 18   Ht 5\' 4"  (1.626 m)   Wt 86.2 kg   LMP 12/07/2018 (Within Days)   SpO2 98%   BMI 32.61 kg/m   Visual Acuity Right Eye Distance:   Left Eye Distance:   Bilateral Distance:    Right Eye Near:   Left Eye Near:    Bilateral Near:     Physical Exam  Constitutional:      General: She is not in acute distress.    Appearance: Normal appearance. She is not ill-appearing or toxic-appearing.  HENT:     Right Ear: Tympanic membrane normal.     Left Ear: Tympanic membrane normal.     Nose: Nose normal. No congestion or rhinorrhea.     Mouth/Throat:     Mouth: Mucous membranes are moist.     Pharynx: No oropharyngeal exudate or posterior oropharyngeal erythema.  Cardiovascular:     Rate and Rhythm: Normal rate and regular rhythm.     Pulses: Normal pulses.  Pulmonary:     Effort: Pulmonary effort is normal. No respiratory distress.     Breath sounds: Normal breath sounds. No rhonchi or rales.  Abdominal:     General: Bowel sounds are normal.     Palpations: Abdomen is soft.  Musculoskeletal: Normal range of motion.        General: No swelling.     Right lower leg: No edema.     Left lower leg: No edema.  Skin:    General: Skin is warm.     Capillary Refill: Capillary refill takes less than 2 seconds.     Findings: No bruising, erythema or lesion.  Neurological:     General: No focal deficit present.     Mental Status: She is alert and oriented to person, place, and time.      UC Treatments / Results  Labs (all labs ordered are listed, but only abnormal results are displayed) Labs Reviewed  NOVEL CORONAVIRUS, NAA (HOSP ORDER, SEND-OUT TO REF LAB; TAT 18-24 HRS)    EKG   Radiology No results found.  Procedures Procedures (including critical care time)  Medications Ordered in UC Medications - No data to display  Initial Impression / Assessment and Plan / UC Course  I have reviewed the triage vital signs and the nursing notes.  Pertinent labs & imaging results that were available during my care of the patient were reviewed by me and considered in my medical decision making (see chart for details).     1.  Flulike symptoms: COVID-19 test: Benadryl as needed for cough If patient develops worsening symptoms such  as shortness of breath, fever, chills, increasing confusion or abdominal  cramps she is advised to return to urgent care to be reevaluated. Final Clinical Impressions(s) / UC Diagnoses   Final diagnoses:  Flu-like symptoms   Discharge Instructions   None    ED Prescriptions    Medication Sig Dispense Auth. Provider   diphenhydrAMINE (BENADRYL) 25 MG tablet Take 1 tablet (25 mg total) by mouth every 6 (six) hours as needed (cough). 30 tablet Lamptey, Britta Mccreedy, MD     PDMP not reviewed this encounter.   Merrilee Jansky, MD 02/15/19 [phone]

## 2019-02-15 NOTE — ED Triage Notes (Addendum)
Patient c/o cough and nasal congestion that started 2-3 days ago. Patient denies fever. Patient states she is 10 weeks 5 days pregnant and wants to know what she can take for her symptoms.

## 2019-02-16 LAB — NOVEL CORONAVIRUS, NAA (HOSP ORDER, SEND-OUT TO REF LAB; TAT 18-24 HRS): SARS-CoV-2, NAA: NOT DETECTED

## 2019-02-21 ENCOUNTER — Telehealth: Payer: Self-pay | Admitting: Obstetrics and Gynecology

## 2019-02-21 ENCOUNTER — Encounter: Payer: Self-pay | Admitting: Obstetrics and Gynecology

## 2019-02-21 NOTE — Progress Notes (Signed)
Message sent to billing

## 2019-02-21 NOTE — Progress Notes (Signed)
For your records, it appears this patient has transferred to [facility] for her prenatal care.Marland KitchenMarland Kitchen

## 2019-02-21 NOTE — Telephone Encounter (Signed)
Attempted to call patient regarding abnormal pap smear.  It appears she has had an appointment through [facility]. I will send a letter to communicate her abnormal pap smear and I am happy to perform colposcopy, if she decides to have this done here.

## 2019-06-19 ENCOUNTER — Telehealth: Payer: Self-pay | Admitting: Licensed Clinical Social Worker

## 2019-06-19 NOTE — Telephone Encounter (Signed)
LCSW scheduled patient for appt. 07/13/2019.

## 2019-06-19 NOTE — Telephone Encounter (Signed)
-----   Message from Si Raider, RN sent at 06/15/2019  2:23 PM EST ----- Lesleigh Noe,  Can you reach out to this patient? She is interested in speaking with you  [phone]  Thanks! Marcelino Duster

## 2019-07-13 ENCOUNTER — Telehealth: Payer: Self-pay | Admitting: Licensed Clinical Social Worker

## 2019-07-13 ENCOUNTER — Ambulatory Visit: Payer: Self-pay | Admitting: Licensed Clinical Social Worker

## 2019-07-13 NOTE — Telephone Encounter (Signed)
attempted to call both numbers listed. Call vm is not set up and no answer at home number. Pt. Had appt. Scheduled this morning but there was a delay in opening due to weather. LCSW was attempting to reschedule patient for a second time.
